# Patient Record
Sex: Male | Born: 1995 | Race: Black or African American | Hispanic: No | Marital: Single | Smoking: Never smoker
Health system: Southern US, Community
[De-identification: ages and names within clinical notes are randomized; demographics above are authoritative.]

## PROBLEM LIST (undated history)

## (undated) DIAGNOSIS — J45909 Unspecified asthma, uncomplicated: Secondary | ICD-10-CM

## (undated) DIAGNOSIS — L309 Dermatitis, unspecified: Secondary | ICD-10-CM

---
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
## 2016-09-28 DIAGNOSIS — Z23 Encounter for immunization: Secondary | ICD-10-CM | POA: Diagnosis not present

## 2016-11-15 DIAGNOSIS — Z0000 Encounter for general adult medical examination without abnormal findings: Secondary | ICD-10-CM | POA: Diagnosis not present

## 2016-11-15 DIAGNOSIS — Z1389 Encounter for screening for other disorder: Secondary | ICD-10-CM | POA: Diagnosis not present

## 2016-12-16 DIAGNOSIS — D729 Disorder of white blood cells, unspecified: Secondary | ICD-10-CM | POA: Diagnosis not present

## 2016-12-22 ENCOUNTER — Encounter: Payer: Self-pay | Admitting: Hematology and Oncology

## 2016-12-22 ENCOUNTER — Telehealth: Payer: Self-pay | Admitting: Hematology and Oncology

## 2016-12-22 NOTE — Telephone Encounter (Signed)
Received a call from the referring office. Scheduled an appt for the pt to see Dr. Pamelia HoitGudena on 2/19 at 1pm. They will notify the pt. Letter mailed to the pt.

## 2016-12-28 DIAGNOSIS — J4530 Mild persistent asthma, uncomplicated: Secondary | ICD-10-CM | POA: Diagnosis not present

## 2016-12-28 DIAGNOSIS — J3089 Other allergic rhinitis: Secondary | ICD-10-CM | POA: Diagnosis not present

## 2016-12-28 DIAGNOSIS — L209 Atopic dermatitis, unspecified: Secondary | ICD-10-CM | POA: Diagnosis not present

## 2016-12-28 DIAGNOSIS — J301 Allergic rhinitis due to pollen: Secondary | ICD-10-CM | POA: Diagnosis not present

## 2017-01-09 ENCOUNTER — Ambulatory Visit: Payer: BLUE CROSS/BLUE SHIELD

## 2017-01-09 ENCOUNTER — Encounter: Payer: Self-pay | Admitting: Hematology and Oncology

## 2017-01-09 ENCOUNTER — Ambulatory Visit (HOSPITAL_BASED_OUTPATIENT_CLINIC_OR_DEPARTMENT_OTHER): Payer: BLUE CROSS/BLUE SHIELD | Admitting: Hematology and Oncology

## 2017-01-09 ENCOUNTER — Other Ambulatory Visit (HOSPITAL_COMMUNITY)
Admission: RE | Admit: 2017-01-09 | Discharge: 2017-01-09 | Disposition: A | Discharge status: Home / Self Care | Payer: BLUE CROSS/BLUE SHIELD | Source: Ambulatory Visit | Attending: Hematology and Oncology | Admitting: Hematology and Oncology

## 2017-01-09 VITALS — BP 114/71 | HR 74 | Temp 98.0°F | Resp 18 | Wt 132.2 lb

## 2017-01-09 DIAGNOSIS — D709 Neutropenia, unspecified: Secondary | ICD-10-CM | POA: Insufficient documentation

## 2017-01-09 DIAGNOSIS — D708 Other neutropenia: Secondary | ICD-10-CM

## 2017-01-09 LAB — CBC & DIFF AND RETIC
BASO%: 1.2 % (ref 0.0–2.0)
Basophils Absolute: 0 10*3/uL (ref 0.0–0.1)
EOS%: 2.1 % (ref 0.0–7.0)
Eosinophils Absolute: 0.1 10*3/uL (ref 0.0–0.5)
HCT: 45.8 % (ref 38.4–49.9)
HGB: 16.6 g/dL (ref 13.0–17.1)
Immature Retic Fract: 0.4 % — ABNORMAL LOW (ref 3.00–10.60)
LYMPH%: 44.6 % (ref 14.0–49.0)
MCH: 32.9 pg (ref 27.2–33.4)
MCHC: 36.2 g/dL — ABNORMAL HIGH (ref 32.0–36.0)
MCV: 90.9 fL (ref 79.3–98.0)
MONO#: 0.3 10*3/uL (ref 0.1–0.9)
MONO%: 12.8 % (ref 0.0–14.0)
NEUT#: 1 10*3/uL — ABNORMAL LOW (ref 1.5–6.5)
NEUT%: 39.3 % (ref 39.0–75.0)
Platelets: 228 10*3/uL (ref 140–400)
RBC: 5.04 10*6/uL (ref 4.20–5.82)
RDW: 11.3 % (ref 11.0–14.6)
Retic %: 1.07 % (ref 0.80–1.80)
Retic Ct Abs: 53.93 10*3/uL (ref 34.80–93.90)
WBC: 2.4 10*3/uL — ABNORMAL LOW (ref 4.0–10.3)
lymph#: 1.1 10*3/uL (ref 0.9–3.3)

## 2017-01-09 LAB — MORPHOLOGY
PLT EST: ADEQUATE
RBC Comments: NORMAL

## 2017-01-09 LAB — LACTATE DEHYDROGENASE: LDH: 138 U/L (ref 125–245)

## 2017-01-09 NOTE — Assessment & Plan Note (Signed)
Differential diagnosis: 1. Infections: Especially viruses like HIV 2. inflammation/autoimmune causes including lupus/rheumatoid arthritis 3. Nutritional causes but N-54 or folic acid deficiency 4. Medication induced 5. Bone marrow disorders  Workup today: 1. CBC with differential with smear 2. O-48 and folic acid levels 3. ANA 4. Flow cytometry  Return to clinic in 1 week to discuss the results and consider bone marrow biopsy if necessary

## 2017-01-09 NOTE — Progress Notes (Signed)
[facility] CONSULT NOTE  No care team member to display  CHIEF COMPLAINTS/PURPOSE OF CONSULTATION:  Newly diagnosed breast cancer  HISTORY OF PRESENTING ILLNESS:  Jackson Buchanan 21 y.o. male is here because of recent diagnosis of leukopenia especially neutropenia. Patient had a workup done 11/15/2016 that revealed white count of 2.6. This was once again repeated 12/19/2015 and it showed the same amount of white blood cells of 2.6 and the differential.the second time revealed an [place] of 1000. The rest of the differential and the rest of the CBC were normal. Because of this change in the white blood cell count he was referred to see [place]. Patient is here today accompanied by his parents and his grandmother. 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 He stays fairly busy. He has not lost any weight. His appetite is good. He denies any fevers chills night sweats or weight loss. He does have mild fatigue although it could be related to the stress of education as well as lack of sleep during nighttime.  I reviewed her records extensively and collaborated the history with the patient.   MEDICAL HISTORY:  Asthma, eczema SURGICAL HISTORY: No prior surgeries SOCIAL HISTORY: Denies any alcohol or recreational drug use or smoking. FAMILY HISTORY: No family history of any cancers.  ALLERGIES:  has no allergies on file.  MEDICATIONS: Beclomethasone inhalation, the Willis of present, albuterol,Dymista inh REVIEW OF SYSTEMS:   Constitutional: Denies fevers, chills or abnormal night sweats Eyes: Denies blurriness of vision, double vision or watery eyes Ears, nose, mouth, throat, and face: Denies mucositis or sore throat Respiratory: Denies cough, dyspnea or wheezes Cardiovascular: Denies palpitation, chest discomfort or lower extremity swelling Gastrointestinal:  Denies nausea, heartburn or change in bowel habits Skin: Denies abnormal skin rashes Lymphatics:  Denies new lymphadenopathy or easy bruising Neurological:Denies numbness, tingling or new weaknesses Behavioral/Psych: Mood is stable, no new changes   All other systems were reviewed with the patient and are negative.  PHYSICAL EXAMINATION: ECOG PERFORMANCE STATUS: 0 - Asymptomatic  Vitals:   01/09/17 1312  BP: 114/71  Pulse: 74  Resp: 18  Temp: 98 F (36.7 C)   Filed Weights   01/09/17 1312  Weight: 132 lb 3.2 oz (60 kg)    GENERAL:alert, no distress and comfortable SKIN: skin color, texture, turgor are normal, no rashes or significant lesions EYES: normal, conjunctiva are pink and non-injected, sclera clear OROPHARYNX:no exudate, no erythema and lips, buccal mucosa, and tongue normal  NECK: supple, thyroid normal size, non-tender, without nodularity LYMPH:  no palpable lymphadenopathy in the cervical, axillary or inguinal LUNGS: clear to auscultation and percussion with normal breathing effort HEART: regular rate & rhythm and no murmurs and no lower extremity edema ABDOMEN:abdomen soft, non-tender and normal bowel sounds Musculoskeletal:no cyanosis of digits and no clubbing  PSYCH: alert & oriented x 3 with fluent speech NEURO: no focal motor/sensory deficits   LABORATORY DATA:  I have reviewed the data as listed Lab Results  Component Value Date   WBC 2.4 (L) 01/09/2017   HGB 16.6 01/09/2017   HCT 45.8 01/09/2017   MCV 90.9 01/09/2017   PLT 228 01/09/2017    ASSESSMENT AND PLAN:  Neutropenia (HCC) Differential diagnosis: 1. Infections: Especially viruses like HIV 2. inflammation/autoimmune causes including lupus/rheumatoid arthritis 3. Nutritional causes but H-21 or folic acid deficiency 4. Medication induced 5. Bone marrow disorders  Workup today: 1. CBC with differential with smear: WBC 2.4 2. B-12  and folic acid levels 3. ANA 4. Flow cytometry  Return to clinic in 1 week to discuss the results and consider bone marrow biopsy if  necessary      All questions were answered. The patient knows to call the clinic with any problems, questions or concerns.    Rulon Eisenmenger, MD 01/09/17

## 2017-01-10 LAB — VITAMIN B12: Vitamin B12: 873 pg/mL (ref 232–1245)

## 2017-01-10 LAB — ANTINUCLEAR ANTIBODIES, IFA
ANA Titer 1: POSITIVE — AB
Homogeneous Pattern: 1:80 {titer}
Speckled Pattern: 1:160 {titer} — ABNORMAL HIGH

## 2017-01-10 LAB — HIV ANTIBODY (ROUTINE TESTING W REFLEX): HIV Screen 4th Generation wRfx: NONREACTIVE

## 2017-01-10 LAB — FOLATE: Folate: 15.3 ng/mL (ref >3.0)

## 2017-01-12 LAB — FLOW CYTOMETRY

## 2017-01-16 ENCOUNTER — Encounter: Payer: Self-pay | Admitting: Hematology and Oncology

## 2017-01-16 ENCOUNTER — Ambulatory Visit (HOSPITAL_BASED_OUTPATIENT_CLINIC_OR_DEPARTMENT_OTHER): Payer: BLUE CROSS/BLUE SHIELD | Admitting: Hematology and Oncology

## 2017-01-16 DIAGNOSIS — D708 Other neutropenia: Secondary | ICD-10-CM

## 2017-01-16 DIAGNOSIS — D709 Neutropenia, unspecified: Secondary | ICD-10-CM | POA: Diagnosis not present

## 2017-01-16 NOTE — Progress Notes (Signed)
No care team member to display  DIAGNOSIS:  Encounter Diagnosis  Name Primary?  . Other neutropenia ([place])     CHIEF COMPLIANT: Follow-up to review blood counts  INTERVAL HISTORY: Jackson Buchanan is a 21 year old with above-mentioned history of neutropenia who is here for workup and to review the labs done recently. He is accompanied by his family to discuss the results. He denies any frequent infections.  REVIEW OF SYSTEMS:   Constitutional: Denies fevers, chills or abnormal weight loss Eyes: Denies blurriness of vision Ears, nose, mouth, throat, and face: Denies mucositis or sore throat Respiratory: Denies cough, dyspnea or wheezes Cardiovascular: Denies palpitation, chest discomfort Gastrointestinal:  Denies nausea, heartburn or change in bowel habits Skin: Denies abnormal skin rashes Lymphatics: Denies new lymphadenopathy or easy bruising Neurological:Denies numbness, tingling or new weaknesses Behavioral/Psych: Mood is stable, no new changes  Extremities: No lower extremity edema  All other systems were reviewed with the patient and are negative.  I have reviewed the past medical history, past surgical history, social history and family history with the patient and they are unchanged from previous note.  ALLERGIES:  has no allergies on file.  PHYSICAL EXAMINATION: ECOG PERFORMANCE STATUS: 0 - Asymptomatic  There were no vitals filed for this visit. There were no vitals filed for this visit.  GENERAL:alert, no distress and comfortable SKIN: skin color, texture, turgor are normal, no rashes or significant lesions EYES: normal, Conjunctiva are pink and non-injected, sclera clear OROPHARYNX:no exudate, no erythema and lips, buccal mucosa, and tongue normal  NECK: supple, thyroid normal size, non-tender, without nodularity LYMPH:  no palpable lymphadenopathy in the cervical, axillary or inguinal LUNGS: clear to auscultation and percussion with normal breathing  effort HEART: regular rate & rhythm and no murmurs and no lower extremity edema ABDOMEN:abdomen soft, non-tender and normal bowel sounds MUSCULOSKELETAL:no cyanosis of digits and no clubbing  NEURO: alert & oriented x 3 with fluent speech, no focal motor/sensory deficits EXTREMITIES: No lower extremity edema  LABORATORY DATA:  I have reviewed the data as listed   Chemistry   No results found for: NA, K, CL, CO2, BUN, CREATININE, GLU No results found for: CALCIUM, ALKPHOS, AST, ALT, BILITOT     Lab Results  Component Value Date   WBC 2.4 (L) 01/09/2017   HGB 16.6 01/09/2017   HCT 45.8 01/09/2017   MCV 90.9 01/09/2017   PLT 228 01/09/2017   NEUTROABS 1.0 (L) 01/09/2017    ASSESSMENT & PLAN:  Neutropenia (HCC) Differential diagnosis: 1. Infections: Especially viruses like HIV 2. inflammation/autoimmune causes including lupus: ANA positive  3. Nutritional causes L-89 or folic acid deficiency: These tests were normal 4. Medication induced: Patient does not take any medications 5. Bone marrow disorders: No abnormalities of red cells or platelets and flow cytometry is normal  Workup today: 1. CBC with differential with smear: WBC 2.4, ANC 1; Hb 16.6, Pl 228 (on 01/09/17) 2. Q-11: 941 and folic acid levels: 74.0; LDH: 138 3. ANA: Positive Titers 1:160 speckled pattern 4. Flow cytometry: Neg 5. HIV: Neg  Discussion: I discussed with the patient that ANA is positive suggesting that the patient may have SLE. I explained to the patient that only a rheumatologist can accurately diagnose and recommend treatment for this condition.  I recommend a rheumatology consultation. If rheumatology felt that he needs a bone marrow biopsy, we're happy to get that done.  Return to clinic in 3 months for follow-up and recheck of his blood counts.  I spent 25 minutes talking to the patient of which more than half was spent in counseling and coordination of care.  No orders of the defined types  were placed in this encounter.  The patient has a good understanding of the overall plan. he agrees with it. he will call with any problems that may develop before the next visit here.   Rulon Eisenmenger, MD 01/16/17

## 2017-01-16 NOTE — Assessment & Plan Note (Signed)
Differential diagnosis: 1. Infections: Especially viruses like HIV 2. inflammation/autoimmune causes including lupus/rheumatoid arthritis 3. Nutritional causes but B-34 or folic acid deficiency 4. Medication induced 5. Bone marrow disorders  Workup today: 1. CBC with differential with smear: WBC 2.4, ANC 1; Hb 16.6, Pl 228 (on 01/09/17) 2. Y-37: 096 and folic acid levels: 43.8; LDH: 138 3. ANA: Positive Titers 1:160 4. Flow cytometry: Neg 5. HIV: Neg  Discussion: I discussed with the patient that ANA is positive suggesting that the patient may have SLE. I explained to the patient that only a rheumatologist can accurately diagnose and recommend treatment for this condition.  I recommend a rheumatology consultation. If rheumatology felt that he needs a bone marrow biopsy, we're happy to get that done.  Return to clinic in 4 months for follow-up and recheck of his blood counts.

## 2017-01-31 ENCOUNTER — Encounter: Payer: Self-pay | Admitting: Hematology and Oncology

## 2017-02-06 ENCOUNTER — Other Ambulatory Visit: Payer: Self-pay

## 2017-02-06 DIAGNOSIS — D708 Other neutropenia: Secondary | ICD-10-CM

## 2017-02-13 ENCOUNTER — Other Ambulatory Visit: Payer: BLUE CROSS/BLUE SHIELD

## 2017-03-07 ENCOUNTER — Other Ambulatory Visit: Payer: Self-pay | Admitting: Emergency Medicine

## 2017-03-07 DIAGNOSIS — D709 Neutropenia, unspecified: Secondary | ICD-10-CM

## 2017-03-09 ENCOUNTER — Telehealth: Payer: Self-pay | Admitting: Emergency Medicine

## 2017-03-09 NOTE — Telephone Encounter (Signed)
Faxed- office visit notes, lab results, demographic form, insurance card to [facility], [place] and Dr Helane Gunther per patient's request for referral to Rheumatology.

## 2017-03-27 DIAGNOSIS — D709 Neutropenia, unspecified: Secondary | ICD-10-CM | POA: Diagnosis not present

## 2017-03-27 DIAGNOSIS — R5383 Other fatigue: Secondary | ICD-10-CM | POA: Diagnosis not present

## 2017-03-27 DIAGNOSIS — R768 Other specified abnormal immunological findings in serum: Secondary | ICD-10-CM | POA: Diagnosis not present

## 2017-04-12 DIAGNOSIS — D709 Neutropenia, unspecified: Secondary | ICD-10-CM | POA: Diagnosis not present

## 2017-04-12 DIAGNOSIS — R768 Other specified abnormal immunological findings in serum: Secondary | ICD-10-CM | POA: Diagnosis not present

## 2017-04-14 ENCOUNTER — Ambulatory Visit (HOSPITAL_BASED_OUTPATIENT_CLINIC_OR_DEPARTMENT_OTHER): Payer: BLUE CROSS/BLUE SHIELD | Admitting: Hematology and Oncology

## 2017-04-14 ENCOUNTER — Other Ambulatory Visit (HOSPITAL_BASED_OUTPATIENT_CLINIC_OR_DEPARTMENT_OTHER): Payer: BLUE CROSS/BLUE SHIELD

## 2017-04-14 ENCOUNTER — Encounter: Payer: Self-pay | Admitting: Hematology and Oncology

## 2017-04-14 ENCOUNTER — Telehealth: Payer: Self-pay | Admitting: Hematology and Oncology

## 2017-04-14 DIAGNOSIS — D709 Neutropenia, unspecified: Secondary | ICD-10-CM

## 2017-04-14 DIAGNOSIS — D708 Other neutropenia: Secondary | ICD-10-CM | POA: Diagnosis not present

## 2017-04-14 LAB — CBC WITH DIFFERENTIAL/PLATELET
BASO%: 1.4 % (ref 0.0–2.0)
Basophils Absolute: 0 10*3/uL (ref 0.0–0.1)
EOS%: 3.4 % (ref 0.0–7.0)
Eosinophils Absolute: 0.1 10*3/uL (ref 0.0–0.5)
HCT: 47.8 % (ref 38.4–49.9)
HGB: 16.5 g/dL (ref 13.0–17.1)
LYMPH%: 46.9 % (ref 14.0–49.0)
MCH: 32.7 pg (ref 27.2–33.4)
MCHC: 34.6 g/dL (ref 32.0–36.0)
MCV: 94.5 fL (ref 79.3–98.0)
MONO#: 0.4 10*3/uL (ref 0.1–0.9)
MONO%: 11.9 % (ref 0.0–14.0)
NEUT#: 1.2 10*3/uL — ABNORMAL LOW (ref 1.5–6.5)
NEUT%: 36.4 % — ABNORMAL LOW (ref 39.0–75.0)
Platelets: 250 10*3/uL (ref 140–400)
RBC: 5.05 10*6/uL (ref 4.20–5.82)
RDW: 11.6 % (ref 11.0–14.6)
WBC: 3.3 10*3/uL — ABNORMAL LOW (ref 4.0–10.3)
lymph#: 1.5 10*3/uL (ref 0.9–3.3)

## 2017-04-14 NOTE — Assessment & Plan Note (Signed)
Previous workup for neutropenia: 1. B-12: 873 and folic acid levels: 15.3; LDH: 138 2. ANA: Positive Titers 1:160 speckled pattern 3. Flow cytometry: Neg 4. HIV: Neg Rheumatology consultation requested  Lab work review:

## 2017-04-14 NOTE — Progress Notes (Signed)
Patient Care Team: Wenda Low, MD as PCP - General (Internal Medicine)  DIAGNOSIS:  Encounter Diagnosis  Name Primary?  . Other neutropenia ([place])     CHIEF COMPLIANT: Follow-up of neutropenia  INTERVAL HISTORY: Jackson Buchanan is a 21 year old with above-mentioned history of neutropenia who was found to be positive for ANA. Patient is here to recheck the blood counts. Denies any new complaints or concerns. Denies any fevers or chills. Patient saw rheumatology and was told that he does not have lupus.  REVIEW OF SYSTEMS:   Constitutional: Denies fevers, chills or abnormal weight loss Eyes: Denies blurriness of vision Ears, nose, mouth, throat, and face: Denies mucositis or sore throat Respiratory: Denies cough, dyspnea or wheezes Cardiovascular: Denies palpitation, chest discomfort Gastrointestinal:  Denies nausea, heartburn or change in bowel habits Skin: Denies abnormal skin rashes Lymphatics: Denies new lymphadenopathy or easy bruising Neurological:Denies numbness, tingling or new weaknesses Behavioral/Psych: Mood is stable, no new changes  Extremities: No lower extremity edema All other systems were reviewed with the patient and are negative.  I have reviewed the past medical history, past surgical history, social history and family history with the patient and they are unchanged from previous note.  ALLERGIES:  has no allergies on file.  MEDICATIONS:  No current outpatient prescriptions on file.   No current facility-administered medications for this visit.     PHYSICAL EXAMINATION: ECOG PERFORMANCE STATUS: 0 - Asymptomatic  Vitals:   04/14/17 0812  BP: 100/70  Pulse: 68  Resp: 18  Temp: 98.2 F (36.8 C)   Filed Weights   04/14/17 0812  Weight: 135 lb 8 oz (61.5 kg)    GENERAL:alert, no distress and comfortable SKIN: skin color, texture, turgor are normal, no rashes or significant lesions EYES: normal, Conjunctiva are pink and non-injected, sclera  clear OROPHARYNX:no exudate, no erythema and lips, buccal mucosa, and tongue normal  NECK: supple, thyroid normal size, non-tender, without nodularity LYMPH:  no palpable lymphadenopathy in the cervical, axillary or inguinal LUNGS: clear to auscultation and percussion with normal breathing effort HEART: regular rate & rhythm and no murmurs and no lower extremity edema ABDOMEN:abdomen soft, non-tender and normal bowel sounds MUSCULOSKELETAL:no cyanosis of digits and no clubbing  NEURO: alert & oriented x 3 with fluent speech, no focal motor/sensory deficits EXTREMITIES: No lower extremity edema  LABORATORY DATA:  I have reviewed the data as listed   Chemistry   No results found for: NA, K, CL, CO2, BUN, CREATININE, GLU No results found for: CALCIUM, ALKPHOS, AST, ALT, BILITOT     Lab Results  Component Value Date   WBC 3.3 (L) 04/14/2017   HGB 16.5 04/14/2017   HCT 47.8 04/14/2017   MCV 94.5 04/14/2017   PLT 250 04/14/2017   NEUTROABS 1.2 (L) 04/14/2017    ASSESSMENT & PLAN:  Neutropenia (HCC) Previous workup for neutropenia: 1. W-09: 811 and folic acid levels: 91.4; LDH: 138 2. ANA: Positive Titers 1:160 speckled pattern 3. Flow cytometry: Neg 4. HIV: Neg Rheumatology consultation requested  Lab work review 04/14/2017 :  WBC 3.3 ANC 1.2 (up from 1) Hemoglobin 16.5 Platelets 250  Patient saw rheumatology and had extensive workup. He was informed that he does not have lupus. Because of this I recommended that we perform a bone marrow biopsy. We will do this on 04/24/2017. I counseled the patient extensively regarding the procedure for the bone marrow aspiration biopsy. I was able to answer all their questions.  I spent 25 minutes talking to the  patient of which more than half was spent in counseling and coordination of care.  No orders of the defined types were placed in this encounter.  The patient has a good understanding of the overall plan. he agrees with it. he  will call with any problems that may develop before the next visit here.   Rulon Eisenmenger, MD 04/14/17

## 2017-04-14 NOTE — Telephone Encounter (Signed)
Gave patient AVS and calender per 5/25 LOS.  

## 2017-04-21 ENCOUNTER — Other Ambulatory Visit: Payer: Self-pay

## 2017-04-21 DIAGNOSIS — D708 Other neutropenia: Secondary | ICD-10-CM

## 2017-04-23 ENCOUNTER — Ambulatory Visit (HOSPITAL_COMMUNITY)
Admission: EM | Admit: 2017-04-23 | Discharge: 2017-04-23 | Discharge status: Against Medical Advice | Payer: BLUE CROSS/BLUE SHIELD

## 2017-04-24 ENCOUNTER — Other Ambulatory Visit (HOSPITAL_COMMUNITY)
Admission: RE | Admit: 2017-04-24 | Discharge: 2017-04-24 | Disposition: A | Discharge status: Home / Self Care | Payer: BLUE CROSS/BLUE SHIELD | Source: Ambulatory Visit | Attending: Hematology and Oncology | Admitting: Hematology and Oncology

## 2017-04-24 ENCOUNTER — Other Ambulatory Visit (HOSPITAL_BASED_OUTPATIENT_CLINIC_OR_DEPARTMENT_OTHER): Payer: BLUE CROSS/BLUE SHIELD

## 2017-04-24 ENCOUNTER — Ambulatory Visit (HOSPITAL_BASED_OUTPATIENT_CLINIC_OR_DEPARTMENT_OTHER): Payer: BLUE CROSS/BLUE SHIELD | Admitting: Hematology and Oncology

## 2017-04-24 ENCOUNTER — Other Ambulatory Visit: Payer: BLUE CROSS/BLUE SHIELD

## 2017-04-24 VITALS — BP 117/72 | HR 71 | Temp 98.4°F

## 2017-04-24 DIAGNOSIS — D708 Other neutropenia: Secondary | ICD-10-CM

## 2017-04-24 DIAGNOSIS — D7589 Other specified diseases of blood and blood-forming organs: Secondary | ICD-10-CM | POA: Diagnosis not present

## 2017-04-24 DIAGNOSIS — D709 Neutropenia, unspecified: Secondary | ICD-10-CM | POA: Diagnosis not present

## 2017-04-24 LAB — CBC WITH DIFFERENTIAL/PLATELET
BASO%: 1.2 % (ref 0.0–2.0)
Basophils Absolute: 0 10*3/uL (ref 0.0–0.1)
EOS%: 2.2 % (ref 0.0–7.0)
Eosinophils Absolute: 0.1 10*3/uL (ref 0.0–0.5)
HCT: 47.5 % (ref 38.4–49.9)
HGB: 16.4 g/dL (ref 13.0–17.1)
LYMPH%: 45.1 % (ref 14.0–49.0)
MCH: 32.7 pg (ref 27.2–33.4)
MCHC: 34.5 g/dL (ref 32.0–36.0)
MCV: 94.8 fL (ref 79.3–98.0)
MONO#: 0.3 10*3/uL (ref 0.1–0.9)
MONO%: 11.5 % (ref 0.0–14.0)
NEUT#: 1.2 10*3/uL — ABNORMAL LOW (ref 1.5–6.5)
NEUT%: 40 % (ref 39.0–75.0)
Platelets: 243 10*3/uL (ref 140–400)
RBC: 5.02 10*6/uL (ref 4.20–5.82)
RDW: 11.8 % (ref 11.0–14.6)
WBC: 2.9 10*3/uL — ABNORMAL LOW (ref 4.0–10.3)
lymph#: 1.3 10*3/uL (ref 0.9–3.3)

## 2017-04-24 LAB — COMPREHENSIVE METABOLIC PANEL
ALT: 16 U/L (ref 0–55)
AST: 21 U/L (ref 5–34)
Albumin: 4.4 g/dL (ref 3.5–5.0)
Alkaline Phosphatase: 80 U/L (ref 40–150)
Anion Gap: 7 mEq/L (ref 3–11)
BUN: 12.5 mg/dL (ref 7.0–26.0)
CO2: 29 mEq/L (ref 22–29)
Calcium: 9.6 mg/dL (ref 8.4–10.4)
Chloride: 104 mEq/L (ref 98–109)
Creatinine: 1 mg/dL (ref 0.7–1.3)
EGFR: >90 mL/min/{1.73_m2} (ref >90)
Glucose: 92 mg/dl (ref 70–140)
Potassium: 4.6 mEq/L (ref 3.5–5.1)
Sodium: 140 mEq/L (ref 136–145)
Total Bilirubin: 0.76 mg/dL (ref 0.20–1.20)
Total Protein: 7.8 g/dL (ref 6.4–8.3)

## 2017-04-24 NOTE — Progress Notes (Signed)
Bone Marrow Biopsy and Aspiration Procedure Note   Informed consent was obtained and potential risks including bleeding, infection and pain were reviewed with the patient. I verified that the patient has been fasting since midnight.  The patient's name, date of birth, identification, consent and allergies were verified prior to the start of procedure and time out was performed.  The left posterior iliac crest was chosen as the site of biopsy.  The skin was prepped with Chlorprep.   8 cc of 1% lidocaine was used to provide local anaesthesia.   10 cc of bone marrow aspirate was obtained followed by 1 inch biopsy.  Pressure was applied to the biopsy site and bandage was placed over the biopsy site. Patient was made to lie on her back for 15 mins prior to discharge.  The procedure was tolerated well. COMPLICATIONS: None BLOOD LOSS: none The patient was discharged home in stable condition with a 1 week follow up to review results. She was provided with post bone marrow biopsy instructions and instructed to call if there was any bleeding or worsening pain.  Specimens sent for flow cytometry, cytogenetics and additional studies.  Signed Gudena, Vinay K, MD   

## 2017-04-24 NOTE — Progress Notes (Signed)
Patient discharged ambulatory. Dressing dry and intact. Bone Marrow Biopsy discharge instructions provided to patient. Patient and parents verbalized understanding.

## 2017-04-24 NOTE — Patient Instructions (Signed)
Bone Marrow Aspiration and Bone Marrow Biopsy, Adult, Care After This sheet gives you information about how to care for yourself after your procedure. Your health care provider may also give you more specific instructions. If you have problems or questions, contact your health care provider. What can I expect after the procedure? After the procedure, it is common to have:  Mild pain and tenderness.  Swelling.  Bruising.  Follow these instructions at home:  Take over-the-counter or prescription medicines only as told by your health care provider.  Do not take baths, swim, or use a hot tub until your health care provider approves. Ask if you can take a shower or have a sponge bath.  Follow instructions from your health care provider about how to take care of the puncture site. Make sure you: ? Wash your hands with soap and water before you change your bandage (dressing). If soap and water are not available, use hand sanitizer. ? Change your dressing as told by your health care provider.  Check your puncture siteevery day for signs of infection. Check for: ? More redness, swelling, or pain. ? More fluid or blood. ? Warmth. ? Pus or a bad smell.  Return to your normal activities as told by your health care provider. Ask your health care provider what activities are safe for you.  Do not drive for 24 hours if you were given a medicine to help you relax (sedative).  Keep all follow-up visits as told by your health care provider. This is important. Contact a health care provider if:  You have more redness, swelling, or pain around the puncture site.  You have more fluid or blood coming from the puncture site.  Your puncture site feels warm to the touch.  You have pus or a bad smell coming from the puncture site.  You have a fever.  Your pain is not controlled with medicine. This information is not intended to replace advice given to you by your health care provider. Make sure  you discuss any questions you have with your health care provider. Document Released: 05/27/2005 Document Revised: 05/27/2016 Document Reviewed: 04/20/2016 Elsevier Interactive Patient Education  2018 [employer].

## 2017-05-02 ENCOUNTER — Ambulatory Visit (HOSPITAL_BASED_OUTPATIENT_CLINIC_OR_DEPARTMENT_OTHER): Payer: BLUE CROSS/BLUE SHIELD | Admitting: Hematology and Oncology

## 2017-05-02 ENCOUNTER — Encounter: Payer: Self-pay | Admitting: Hematology and Oncology

## 2017-05-02 DIAGNOSIS — D709 Neutropenia, unspecified: Secondary | ICD-10-CM

## 2017-05-02 DIAGNOSIS — D708 Other neutropenia: Secondary | ICD-10-CM

## 2017-05-02 NOTE — Assessment & Plan Note (Signed)
1. F-20: 721 and folic acid levels: 82.8; LDH: 138 2. ANA: Positive Titers 1:160 speckled pattern 3. Flow cytometry: Neg 4. HIV: Neg Rheumatology consultation: Did not think he has lupus   Bone Marrow Biopsy 04/25/17: The bone marrow is variably cellular (slightly hypocellular for age) with trilineage hematopoiesis but with relative abundance of erythroid precursors and apparently decreased granulocytic cells. The latter however, displays progressive maturation including relative abundance of mature neutrophils. No significant dysgranulopoiesis or increase in blastic cells is identified. Megakaryocytes are abundant with scattered atypical forms. The overall myeloid changes are not considered specific and likely represent secondary changesPrevious workup for neutropenia:  Discussion; Indicates auto immune process like lupus and no primary bone marrow disorder. Plan: Watchful monitoring

## 2017-05-02 NOTE — Progress Notes (Signed)
Patient Care Team: Wenda Low, MD as PCP - General (Internal Medicine)  DIAGNOSIS:  Encounter Diagnosis  Name Primary?  . Other neutropenia ([place])     CHIEF COMPLIANT: Follow-up of neutropenia after recent bone marrow biopsy  INTERVAL HISTORY: Jackson Buchanan is a 21 year old with above-mentioned history of neutropenia with an ANC 1.2 is here for a follow-up for a bone marrow biopsy that he had last week. He does not have any pain or discomfort from the bone marrow procedure. He had gone to [place] and spent several days in [place].  REVIEW OF SYSTEMS:   Constitutional: Denies fevers, chills or abnormal weight loss Eyes: Denies blurriness of vision Ears, nose, mouth, throat, and face: Denies mucositis or sore throat Respiratory: Denies cough, dyspnea or wheezes Cardiovascular: Denies palpitation, chest discomfort Gastrointestinal:  Denies nausea, heartburn or change in bowel habits Skin: Denies abnormal skin rashes Lymphatics: Denies new lymphadenopathy or easy bruising Neurological:Denies numbness, tingling or new weaknesses Behavioral/Psych: Mood is stable, no new changes  Extremities: No lower extremity edema  All other systems were reviewed with the patient and are negative.  I have reviewed the past medical history, past surgical history, social history and family history with the patient and they are unchanged from previous note.  ALLERGIES:  has no allergies on file.  MEDICATIONS:  No current outpatient prescriptions on file.   No current facility-administered medications for this visit.     PHYSICAL EXAMINATION: ECOG PERFORMANCE STATUS: 0 - Asymptomatic  Vitals:   05/02/17 1418  BP: 119/71  Pulse: 73  Resp: 18  Temp: 97.3 F (36.3 C)   Filed Weights   05/02/17 1418  Weight: 135 lb 9.6 oz (61.5 kg)    GENERAL:alert, no distress and comfortable SKIN: skin color, texture, turgor are normal, no rashes or significant lesions EYES: normal, Conjunctiva  are pink and non-injected, sclera clear OROPHARYNX:no exudate, no erythema and lips, buccal mucosa, and tongue normal  NECK: supple, thyroid normal size, non-tender, without nodularity LYMPH:  no palpable lymphadenopathy in the cervical, axillary or inguinal LUNGS: clear to auscultation and percussion with normal breathing effort HEART: regular rate & rhythm and no murmurs and no lower extremity edema ABDOMEN:abdomen soft, non-tender and normal bowel sounds MUSCULOSKELETAL:no cyanosis of digits and no clubbing  NEURO: alert & oriented x 3 with fluent speech, no focal motor/sensory deficits EXTREMITIES: No lower extremity edema  LABORATORY DATA:  I have reviewed the data as listed   Chemistry      Component Value Date/Time   NA 140 04/24/2017 1020   K 4.6 04/24/2017 1020   CO2 29 04/24/2017 1020   BUN 12.5 04/24/2017 1020   CREATININE 1.0 04/24/2017 1020      Component Value Date/Time   CALCIUM 9.6 04/24/2017 1020   ALKPHOS 80 04/24/2017 1020   AST 21 04/24/2017 1020   ALT 16 04/24/2017 1020   BILITOT 0.76 04/24/2017 1020       Lab Results  Component Value Date   WBC 2.9 (L) 04/24/2017   HGB 16.4 04/24/2017   HCT 47.5 04/24/2017   MCV 94.8 04/24/2017   PLT 243 04/24/2017   NEUTROABS 1.2 (L) 04/24/2017    ASSESSMENT & PLAN:  Neutropenia (HCC) Previous workup for neutropenia: 1. W-10: 272 and folic acid levels: 53.6; LDH: 138 2. ANA: Positive Titers 1:160 speckled pattern 3. Flow cytometry: Neg 4. HIV: Neg Rheumatology consultation: Did not think he has lupus  Bone Marrow Biopsy 04/25/17: The bone marrow is variably cellular (slightly  hypocellular for age) with trilineage hematopoiesis but with relative abundance of erythroid precursors and apparently decreased granulocytic cells. The latter however, displays progressive maturation including relative abundance of mature neutrophils. No significant dysgranulopoiesis or increase in blastic cells is identified.  Megakaryocytes are abundant with scattered atypical forms. The overall myeloid changes are not considered specific and likely represent secondary changes  Discussion; Indicates auto immune process like lupus and no primary bone marrow disorder. Plan: Watchful monitoring with recheck of CBC in 3 months. If remains stable then we can recheck in 6 months after that. Patient plans to go to [facility] for second opinion regarding rheumatology evaluation.   I spent 25 minutes talking to the patient of which more than half was spent in counseling and coordination of care.  Orders Placed This Encounter  Procedures  . CBC with Differential    Standing Status:   Future    Standing Expiration Date:   05/02/2018   The patient has a good understanding of the overall plan. he agrees with it. he will call with any problems that may develop before the next visit here.   Rulon Eisenmenger, MD 05/02/17

## 2017-05-05 DIAGNOSIS — J309 Allergic rhinitis, unspecified: Secondary | ICD-10-CM | POA: Diagnosis not present

## 2017-05-05 DIAGNOSIS — J329 Chronic sinusitis, unspecified: Secondary | ICD-10-CM | POA: Diagnosis not present

## 2017-05-17 ENCOUNTER — Encounter (HOSPITAL_COMMUNITY): Payer: Self-pay

## 2017-05-25 DIAGNOSIS — Z79899 Other long term (current) drug therapy: Secondary | ICD-10-CM | POA: Diagnosis not present

## 2017-05-25 DIAGNOSIS — L309 Dermatitis, unspecified: Secondary | ICD-10-CM | POA: Diagnosis not present

## 2017-05-25 DIAGNOSIS — L700 Acne vulgaris: Secondary | ICD-10-CM | POA: Diagnosis not present

## 2017-05-26 LAB — CHROMOSOME ANALYSIS, BONE MARROW

## 2017-05-28 DIAGNOSIS — R1084 Generalized abdominal pain: Secondary | ICD-10-CM | POA: Diagnosis not present

## 2017-05-28 DIAGNOSIS — E010 Iodine-deficiency related diffuse (endemic) goiter: Secondary | ICD-10-CM | POA: Diagnosis not present

## 2017-06-01 ENCOUNTER — Other Ambulatory Visit: Payer: Self-pay | Admitting: Internal Medicine

## 2017-06-01 DIAGNOSIS — D72819 Decreased white blood cell count, unspecified: Secondary | ICD-10-CM | POA: Diagnosis not present

## 2017-06-01 DIAGNOSIS — R109 Unspecified abdominal pain: Secondary | ICD-10-CM | POA: Diagnosis not present

## 2017-06-05 ENCOUNTER — Ambulatory Visit
Admission: RE | Admit: 2017-06-05 | Discharge: 2017-06-05 | Disposition: A | Discharge status: Home / Self Care | Payer: BLUE CROSS/BLUE SHIELD | Source: Ambulatory Visit | Attending: Internal Medicine | Admitting: Internal Medicine

## 2017-06-05 DIAGNOSIS — R112 Nausea with vomiting, unspecified: Secondary | ICD-10-CM | POA: Diagnosis not present

## 2017-06-05 DIAGNOSIS — R1032 Left lower quadrant pain: Secondary | ICD-10-CM | POA: Diagnosis not present

## 2017-06-05 DIAGNOSIS — R109 Unspecified abdominal pain: Secondary | ICD-10-CM

## 2017-06-05 MED ORDER — IOPAMIDOL (ISOVUE-300) INJECTION 61%
100.0000 mL | Freq: Once | INTRAVENOUS | Status: AC | PRN
  Administered 2017-06-05: 100 mL via INTRAVENOUS

## 2017-06-13 DIAGNOSIS — R768 Other specified abnormal immunological findings in serum: Secondary | ICD-10-CM | POA: Diagnosis not present

## 2017-06-22 DIAGNOSIS — L700 Acne vulgaris: Secondary | ICD-10-CM | POA: Diagnosis not present

## 2017-08-02 ENCOUNTER — Other Ambulatory Visit: Payer: BLUE CROSS/BLUE SHIELD

## 2017-08-02 ENCOUNTER — Ambulatory Visit: Payer: BLUE CROSS/BLUE SHIELD | Admitting: Hematology and Oncology

## 2017-08-04 ENCOUNTER — Ambulatory Visit: Payer: BLUE CROSS/BLUE SHIELD | Admitting: Hematology and Oncology

## 2017-08-04 ENCOUNTER — Other Ambulatory Visit: Payer: BLUE CROSS/BLUE SHIELD

## 2017-08-11 ENCOUNTER — Other Ambulatory Visit: Payer: BLUE CROSS/BLUE SHIELD

## 2017-08-11 ENCOUNTER — Ambulatory Visit: Payer: BLUE CROSS/BLUE SHIELD | Admitting: Hematology and Oncology

## 2017-08-14 ENCOUNTER — Telehealth: Payer: Self-pay

## 2017-08-14 ENCOUNTER — Encounter: Payer: Self-pay | Admitting: Hematology and Oncology

## 2017-08-14 ENCOUNTER — Inpatient Hospital Stay: Payer: BLUE CROSS/BLUE SHIELD | Attending: Hematology and Oncology | Admitting: Hematology and Oncology

## 2017-08-14 ENCOUNTER — Other Ambulatory Visit (HOSPITAL_BASED_OUTPATIENT_CLINIC_OR_DEPARTMENT_OTHER): Payer: BLUE CROSS/BLUE SHIELD

## 2017-08-14 DIAGNOSIS — D709 Neutropenia, unspecified: Secondary | ICD-10-CM | POA: Diagnosis not present

## 2017-08-14 DIAGNOSIS — D708 Other neutropenia: Secondary | ICD-10-CM

## 2017-08-14 LAB — CBC WITH DIFFERENTIAL/PLATELET
BASO%: 0.7 % (ref 0.0–2.0)
Basophils Absolute: 0 10*3/uL (ref 0.0–0.1)
EOS%: 4.6 % (ref 0.0–7.0)
Eosinophils Absolute: 0.1 10*3/uL (ref 0.0–0.5)
HCT: 44.4 % (ref 38.4–49.9)
HGB: 15.7 g/dL (ref 13.0–17.1)
LYMPH%: 42.6 % (ref 14.0–49.0)
MCH: 32.9 pg (ref 27.2–33.4)
MCHC: 35.4 g/dL (ref 32.0–36.0)
MCV: 93.1 fL (ref 79.3–98.0)
MONO#: 0.3 10*3/uL (ref 0.1–0.9)
MONO%: 11.7 % (ref 0.0–14.0)
NEUT#: 1.1 10*3/uL — ABNORMAL LOW (ref 1.5–6.5)
NEUT%: 40.4 % (ref 39.0–75.0)
Platelets: 224 10*3/uL (ref 140–400)
RBC: 4.77 10*6/uL (ref 4.20–5.82)
RDW: 11.5 % (ref 11.0–14.6)
WBC: 2.8 10*3/uL — ABNORMAL LOW (ref 4.0–10.3)
lymph#: 1.2 10*3/uL (ref 0.9–3.3)

## 2017-08-14 NOTE — Progress Notes (Signed)
Patient Care Team: Wenda Low, MD as PCP - General (Internal Medicine)  DIAGNOSIS:  Encounter Diagnosis  Name Primary?  . Other neutropenia ([place])    CHIEF COMPLIANT: Follow-up of chronic neutropenia  INTERVAL HISTORY: Jackson Buchanan is a 21 year old after Jackson Buchanan gentleman with chronic neutropenia were extensive workup including a bone marrow biopsy which did not reveal any major abnormalities other 1 suggesting that it is not immune phenomenon. He was seen at rheumatology clinic at [facility] and they did not diagnose him with any autoimmune disorder. She is being seen after a three-month follow-up. She reports no new concerns or complaints. 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  REVIEW OF SYSTEMS:   Constitutional: Denies fevers, chills or abnormal weight loss Eyes: Denies blurriness of vision Ears, nose, mouth, throat, and face: Denies mucositis or sore throat Respiratory: Denies cough, dyspnea or wheezes Cardiovascular: Denies palpitation, chest discomfort Gastrointestinal:  Denies nausea, heartburn or change in bowel habits Skin: Denies abnormal skin rashes Lymphatics: Denies new lymphadenopathy or easy bruising Neurological:Denies numbness, tingling or new weaknesses Behavioral/Psych: Mood is stable, no new changes  Extremities: No lower extremity edema All other systems were reviewed with the patient and are negative.  I have reviewed the past medical history, past surgical history, social history and family history with the patient and they are unchanged from previous note.  ALLERGIES:  has no allergies on file.  MEDICATIONS:  No current outpatient prescriptions on file.   No current facility-administered medications for this visit.     PHYSICAL EXAMINATION: ECOG PERFORMANCE STATUS: 1 - Symptomatic but completely ambulatory  Vitals:   08/14/17 1519  BP: 112/69  Pulse: 73  Resp: 18  Temp: 98.4 F (36.9 C)  SpO2: 100%   Filed Weights   08/14/17 1519    Weight: 134 lb 6.4 oz (61 kg)    GENERAL:alert, no distress and comfortable SKIN: skin color, texture, turgor are normal, no rashes or significant lesions EYES: normal, Conjunctiva are pink and non-injected, sclera clear OROPHARYNX:no exudate, no erythema and lips, buccal mucosa, and tongue normal  NECK: supple, thyroid normal size, non-tender, without nodularity LYMPH:  no palpable lymphadenopathy in the cervical, axillary or inguinal LUNGS: clear to auscultation and percussion with normal breathing effort HEART: regular rate & rhythm and no murmurs and no lower extremity edema ABDOMEN:abdomen soft, non-tender and normal bowel sounds MUSCULOSKELETAL:no cyanosis of digits and no clubbing  NEURO: alert & oriented x 3 with fluent speech, no focal motor/sensory deficits EXTREMITIES: No lower extremity edema  LABORATORY DATA:  I have reviewed the data as listed   Chemistry      Component Value Date/Time   NA 140 04/24/2017 1020   K 4.6 04/24/2017 1020   CO2 29 04/24/2017 1020   BUN 12.5 04/24/2017 1020   CREATININE 1.0 04/24/2017 1020      Component Value Date/Time   CALCIUM 9.6 04/24/2017 1020   ALKPHOS 80 04/24/2017 1020   AST 21 04/24/2017 1020   ALT 16 04/24/2017 1020   BILITOT 0.76 04/24/2017 1020       Lab Results  Component Value Date   WBC 2.8 (L) 08/14/2017   HGB 15.7 08/14/2017   HCT 44.4 08/14/2017   MCV 93.1 08/14/2017   PLT 224 08/14/2017   NEUTROABS 1.1 (L) 08/14/2017    ASSESSMENT & PLAN:  Neutropenia (HCC) Previous workup for neutropenia: 1. B-09: 628ZMO folic acid levels: 29.4; LDH: 138 2.ANA: Positive Titers 1:160 speckled pattern 3. Flow cytometry: Neg  4. HIV: Neg Rheumatology consultation: Did not think he has lupus  Bone Marrow Biopsy 04/25/17: The bone marrow is variably cellular (slightly hypocellular for age) with trilineage hematopoiesis but with relative abundance of erythroid precursors and apparently decreased granulocytic cells. The  latter however, displays progressive maturation including relative abundance of mature neutrophils. No significant dysgranulopoiesis or increase in blastic cells is identified. Megakaryocytes are abundant with scattered atypical forms. The overall myeloid changes are not considered specific and likely represent secondary changes  Lab review:Labs remained relatively unchanged ANC 1100  Discussion; Indicates auto immune process like lupus and no primary bone marrow disorder. However that if her blood work did not identify any autoimmune processes. Plan: Watchful monitoring with recheck of CBC in 6 months.  I spent 25 minutes talking to the patient of which more than half was spent in counseling and coordination of care.  No orders of the defined types were placed in this encounter.  The patient has a good understanding of the overall plan. he agrees with it. he will call with any problems that may develop before the next visit here.   Gudena, Vinay K, MD 08/14/17    

## 2017-08-14 NOTE — Assessment & Plan Note (Signed)
Previous workup for neutropenia: 1. T-00: 525LTG folic acid levels: 28.9; LDH: 138 2.ANA: Positive Titers 1:160 speckled pattern 3. Flow cytometry: Neg 4. HIV: Neg Rheumatology consultation: Did not think he has lupus  Bone Marrow Biopsy 04/25/17: The bone marrow is variably cellular (slightly hypocellular for age) with trilineage hematopoiesis but with relative abundance of erythroid precursors and apparently decreased granulocytic cells. The latter however, displays progressive maturation including relative abundance of mature neutrophils. No significant dysgranulopoiesis or increase in blastic cells is identified. Megakaryocytes are abundant with scattered atypical forms. The overall myeloid changes are not considered specific and likely represent secondary changes  Lab review:  Discussion; Indicates auto immune process like lupus and no primary bone marrow disorder. Plan: Watchful monitoring with recheck of CBC in 6 months.

## 2017-08-14 NOTE — Telephone Encounter (Signed)
Gave patient avs and calender for upcoming appointment. Per 9/24 los

## 2017-08-30 DIAGNOSIS — Z23 Encounter for immunization: Secondary | ICD-10-CM | POA: Diagnosis not present

## 2017-09-28 DIAGNOSIS — L819 Disorder of pigmentation, unspecified: Secondary | ICD-10-CM | POA: Diagnosis not present

## 2017-09-28 DIAGNOSIS — L700 Acne vulgaris: Secondary | ICD-10-CM | POA: Diagnosis not present

## 2017-11-27 DIAGNOSIS — Z136 Encounter for screening for cardiovascular disorders: Secondary | ICD-10-CM | POA: Diagnosis not present

## 2017-11-27 DIAGNOSIS — Z1389 Encounter for screening for other disorder: Secondary | ICD-10-CM | POA: Diagnosis not present

## 2017-11-27 DIAGNOSIS — Z0000 Encounter for general adult medical examination without abnormal findings: Secondary | ICD-10-CM | POA: Diagnosis not present

## 2017-12-19 DIAGNOSIS — D72819 Decreased white blood cell count, unspecified: Secondary | ICD-10-CM | POA: Diagnosis not present

## 2017-12-19 DIAGNOSIS — Z6820 Body mass index (BMI) 20.0-20.9, adult: Secondary | ICD-10-CM | POA: Diagnosis not present

## 2018-01-01 DIAGNOSIS — J301 Allergic rhinitis due to pollen: Secondary | ICD-10-CM | POA: Diagnosis not present

## 2018-01-01 DIAGNOSIS — J4530 Mild persistent asthma, uncomplicated: Secondary | ICD-10-CM | POA: Diagnosis not present

## 2018-01-01 DIAGNOSIS — L209 Atopic dermatitis, unspecified: Secondary | ICD-10-CM | POA: Diagnosis not present

## 2018-01-01 DIAGNOSIS — J3089 Other allergic rhinitis: Secondary | ICD-10-CM | POA: Diagnosis not present

## 2018-01-02 DIAGNOSIS — Z91018 Allergy to other foods: Secondary | ICD-10-CM | POA: Diagnosis not present

## 2018-01-03 ENCOUNTER — Telehealth: Payer: Self-pay | Admitting: Hematology and Oncology

## 2018-01-03 NOTE — Telephone Encounter (Signed)
Returned call to patient as he was requesting Lab Appt for 02/26/18.  He had to cancel last appointment.

## 2018-02-12 ENCOUNTER — Other Ambulatory Visit: Payer: BLUE CROSS/BLUE SHIELD

## 2018-02-26 ENCOUNTER — Telehealth: Payer: Self-pay | Admitting: Hematology and Oncology

## 2018-02-26 ENCOUNTER — Inpatient Hospital Stay: Payer: BLUE CROSS/BLUE SHIELD | Attending: Hematology and Oncology

## 2018-02-26 DIAGNOSIS — D709 Neutropenia, unspecified: Secondary | ICD-10-CM | POA: Insufficient documentation

## 2018-02-26 DIAGNOSIS — D708 Other neutropenia: Secondary | ICD-10-CM

## 2018-02-26 LAB — CBC WITH DIFFERENTIAL/PLATELET
Basophils Absolute: 0 10*3/uL (ref 0.0–0.1)
Basophils Relative: 1 %
Eosinophils Absolute: 0 10*3/uL (ref 0.0–0.5)
Eosinophils Relative: 1 %
HCT: 46.2 % (ref 38.4–49.9)
Hemoglobin: 15.9 g/dL (ref 13.0–17.1)
Lymphocytes Relative: 44 %
Lymphs Abs: 1.3 10*3/uL (ref 0.9–3.3)
MCH: 32.4 pg (ref 27.2–33.4)
MCHC: 34.5 g/dL (ref 32.0–36.0)
MCV: 94 fL (ref 79.3–98.0)
Monocytes Absolute: 0.4 10*3/uL (ref 0.1–0.9)
Monocytes Relative: 13 %
Neutro Abs: 1.2 10*3/uL — ABNORMAL LOW (ref 1.5–6.5)
Neutrophils Relative %: 41 %
Platelets: 241 10*3/uL (ref 140–400)
RBC: 4.91 MIL/uL (ref 4.20–5.82)
RDW: 11.5 % (ref 11.0–14.6)
WBC: 2.8 10*3/uL — ABNORMAL LOW (ref 4.0–10.3)

## 2018-02-26 NOTE — Telephone Encounter (Signed)
I informed the patient that his CBC is exactly the same as where it has been over the past 1 year. No indication to do any other investigations. Patient is asymptomatic. I will see him back in 1 year with labs and follow-up

## 2018-02-28 ENCOUNTER — Telehealth: Payer: Self-pay | Admitting: Hematology and Oncology

## 2018-02-28 NOTE — Telephone Encounter (Signed)
Mailed patient calendar of upcoming April 2020 appointments per 4/8 sch message

## 2018-03-08 DIAGNOSIS — J301 Allergic rhinitis due to pollen: Secondary | ICD-10-CM | POA: Diagnosis not present

## 2018-03-08 DIAGNOSIS — J4530 Mild persistent asthma, uncomplicated: Secondary | ICD-10-CM | POA: Diagnosis not present

## 2018-03-08 DIAGNOSIS — J3089 Other allergic rhinitis: Secondary | ICD-10-CM | POA: Diagnosis not present

## 2018-03-08 DIAGNOSIS — Z91018 Allergy to other foods: Secondary | ICD-10-CM | POA: Diagnosis not present

## 2018-08-10 DIAGNOSIS — H0016 Chalazion left eye, unspecified eyelid: Secondary | ICD-10-CM | POA: Diagnosis not present

## 2018-08-13 DIAGNOSIS — H00016 Hordeolum externum left eye, unspecified eyelid: Secondary | ICD-10-CM | POA: Diagnosis not present

## 2018-08-15 DIAGNOSIS — Z23 Encounter for immunization: Secondary | ICD-10-CM | POA: Diagnosis not present

## 2018-10-03 ENCOUNTER — Other Ambulatory Visit: Payer: Self-pay | Admitting: Internal Medicine

## 2018-10-03 DIAGNOSIS — R6250 Unspecified lack of expected normal physiological development in childhood: Secondary | ICD-10-CM

## 2018-10-03 DIAGNOSIS — R41840 Attention and concentration deficit: Secondary | ICD-10-CM | POA: Diagnosis not present

## 2018-10-03 DIAGNOSIS — F411 Generalized anxiety disorder: Secondary | ICD-10-CM | POA: Diagnosis not present

## 2018-10-17 ENCOUNTER — Ambulatory Visit
Admission: RE | Admit: 2018-10-17 | Discharge: 2018-10-17 | Disposition: A | Discharge status: Home / Self Care | Payer: BLUE CROSS/BLUE SHIELD | Source: Ambulatory Visit | Attending: Internal Medicine | Admitting: Internal Medicine

## 2018-10-17 DIAGNOSIS — R6250 Unspecified lack of expected normal physiological development in childhood: Secondary | ICD-10-CM

## 2018-11-07 DIAGNOSIS — R41840 Attention and concentration deficit: Secondary | ICD-10-CM | POA: Diagnosis not present

## 2018-11-07 DIAGNOSIS — D72819 Decreased white blood cell count, unspecified: Secondary | ICD-10-CM | POA: Diagnosis not present

## 2018-12-31 DIAGNOSIS — J301 Allergic rhinitis due to pollen: Secondary | ICD-10-CM | POA: Diagnosis not present

## 2018-12-31 DIAGNOSIS — J3089 Other allergic rhinitis: Secondary | ICD-10-CM | POA: Diagnosis not present

## 2018-12-31 DIAGNOSIS — L209 Atopic dermatitis, unspecified: Secondary | ICD-10-CM | POA: Diagnosis not present

## 2018-12-31 DIAGNOSIS — J4530 Mild persistent asthma, uncomplicated: Secondary | ICD-10-CM | POA: Diagnosis not present

## 2019-01-14 DIAGNOSIS — F840 Autistic disorder: Secondary | ICD-10-CM | POA: Diagnosis not present

## 2019-02-05 ENCOUNTER — Telehealth: Payer: Self-pay | Admitting: Hematology and Oncology

## 2019-02-05 NOTE — Telephone Encounter (Signed)
VG PAL 4/6 moved lab/fu to 4/7. Left message. Schedule mailed.

## 2019-02-25 ENCOUNTER — Other Ambulatory Visit: Payer: BLUE CROSS/BLUE SHIELD

## 2019-02-25 ENCOUNTER — Ambulatory Visit: Payer: BLUE CROSS/BLUE SHIELD | Admitting: Hematology and Oncology

## 2019-02-26 ENCOUNTER — Inpatient Hospital Stay: Payer: BLUE CROSS/BLUE SHIELD | Attending: Internal Medicine

## 2019-02-26 ENCOUNTER — Inpatient Hospital Stay: Payer: BLUE CROSS/BLUE SHIELD | Admitting: Hematology and Oncology

## 2019-02-26 NOTE — Assessment & Plan Note (Deleted)
Previous workup for neutropenia: 1. U-67: 519WYS folic acid levels: 29.9; LDH: 138 2.ANA: Positive Titers 1:160 speckled pattern 3. Flow cytometry: Neg 4. HIV: Neg Rheumatology consultation: Did not think he has lupus  Bone Marrow Biopsy 04/25/17: The bone marrow is variably cellular (slightly hypocellular for age) with trilineage hematopoiesis but with relative abundance of erythroid precursors and apparently decreased granulocytic cells. The latter however, displays progressive maturation including relative abundance of mature neutrophils. No significant dysgranulopoiesis or increase in blastic cells is identified. Megakaryocytes are abundant with scattered atypical forms. The overall myeloid changes are not considered specific and likely represent secondary changes  Lab review 02/26/2019:  Return to clinic in 1 year with labs and follow-up

## 2019-04-30 DIAGNOSIS — H0014 Chalazion left upper eyelid: Secondary | ICD-10-CM | POA: Diagnosis not present

## 2019-07-26 DIAGNOSIS — Z1159 Encounter for screening for other viral diseases: Secondary | ICD-10-CM | POA: Diagnosis not present

## 2019-07-26 DIAGNOSIS — Z20828 Contact with and (suspected) exposure to other viral communicable diseases: Secondary | ICD-10-CM | POA: Diagnosis not present

## 2019-07-26 DIAGNOSIS — R0981 Nasal congestion: Secondary | ICD-10-CM | POA: Diagnosis not present

## 2019-08-03 DIAGNOSIS — Z23 Encounter for immunization: Secondary | ICD-10-CM | POA: Diagnosis not present

## 2020-01-21 ENCOUNTER — Ambulatory Visit (INDEPENDENT_AMBULATORY_CARE_PROVIDER_SITE_OTHER): Payer: BC Managed Care – PPO | Admitting: Clinical

## 2020-01-21 DIAGNOSIS — F840 Autistic disorder: Secondary | ICD-10-CM

## 2020-02-03 ENCOUNTER — Ambulatory Visit (INDEPENDENT_AMBULATORY_CARE_PROVIDER_SITE_OTHER): Payer: BC Managed Care – PPO | Admitting: Clinical

## 2020-02-03 DIAGNOSIS — F840 Autistic disorder: Secondary | ICD-10-CM | POA: Diagnosis not present

## 2020-02-11 ENCOUNTER — Ambulatory Visit (INDEPENDENT_AMBULATORY_CARE_PROVIDER_SITE_OTHER): Payer: BC Managed Care – PPO | Admitting: Clinical

## 2020-02-11 DIAGNOSIS — F840 Autistic disorder: Secondary | ICD-10-CM

## 2020-03-31 ENCOUNTER — Ambulatory Visit: Payer: BC Managed Care – PPO | Admitting: Clinical

## 2020-04-01 ENCOUNTER — Ambulatory Visit (INDEPENDENT_AMBULATORY_CARE_PROVIDER_SITE_OTHER): Payer: BC Managed Care – PPO | Admitting: Clinical

## 2020-04-01 DIAGNOSIS — F840 Autistic disorder: Secondary | ICD-10-CM

## 2020-04-02 IMAGING — MR MR HEAD W/O CM
10 series · 48 of 48 positions shown · non-contrast
Comparison: None.

CLINICAL DATA: Development delay

EXAM:
MRI HEAD WITHOUT CONTRAST
TECHNIQUE: Multiplanar, multiecho pulse sequences of the brain and surrounding
structures were obtained without intravenous contrast.

[Series 2: T1 · sagittal · 5.0mm · 0.45mm/px · 1 of 24 slices shown]
[im 1/24]
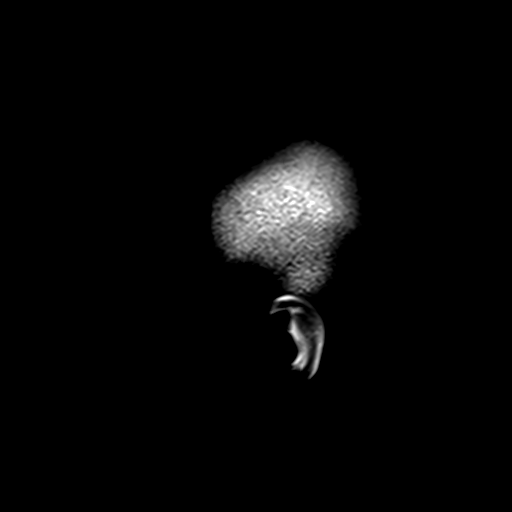

[Series 3: DWI · axial · 3.0mm · 1.80mm/px · z∈[-57,+94]mm · 9 of 104 slices shown (1 of 4)]
[im 1/104]
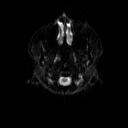
[im 13/104]
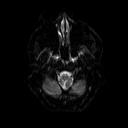
[im 26/104]
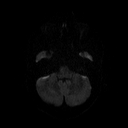
[im 39/104]
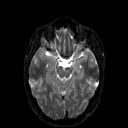
[im 52/104]
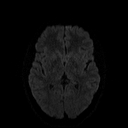
[im 65/104]
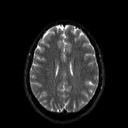
[im 78/104]
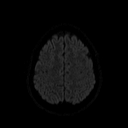
[im 91/104]
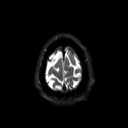
[im 104/104]
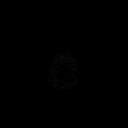

[Series 4: DWI · axial · 3.0mm · 1.80mm/px · z∈[-57,+94]mm · 4 of 52 slices shown (2 of 4)]
[im 1/52]
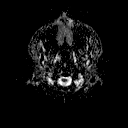
[im 18/52]
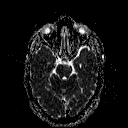
[im 35/52]
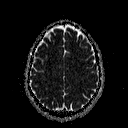
[im 52/52]
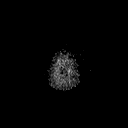

[Series 5: FLAIR · axial · 3.0mm · 0.45mm/px · z∈[-55,+92]mm · 3 of 33 slices shown]
[im 1/33]
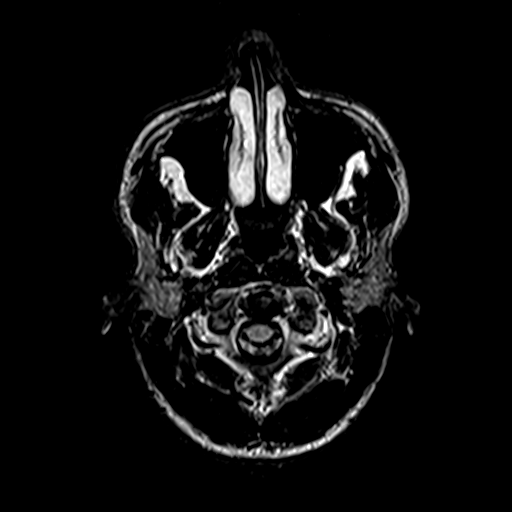
[im 17/33]
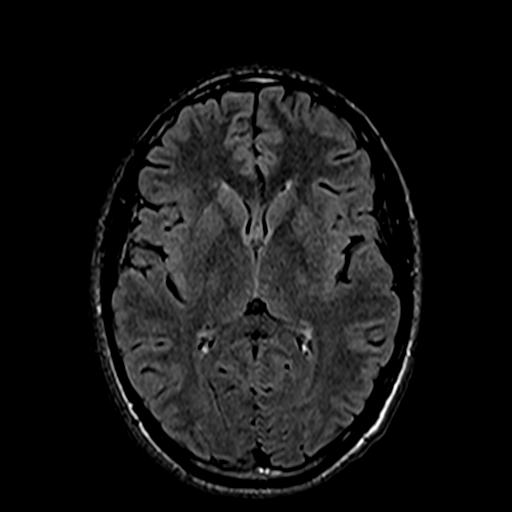
[im 33/33]
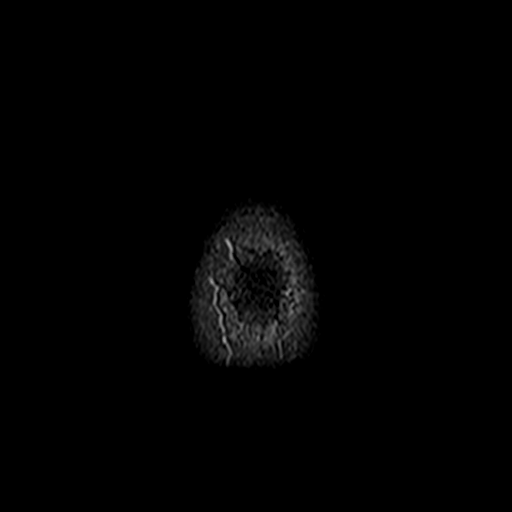

[Series 6: T2 · axial · 5.0mm · 0.60mm/px · z∈[-52,+95]mm · 2 of 23 slices shown (1 of 2)]
[im 1/23]
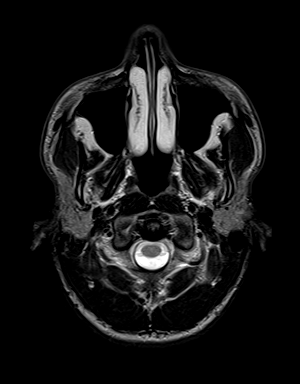
[im 23/23]
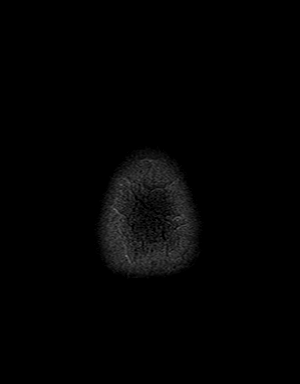

[Series 8: swi_images · axial · 5.0mm · 0.90mm/px · z∈[-52,+102]mm · 3 of 32 slices shown]
[im 1/32]
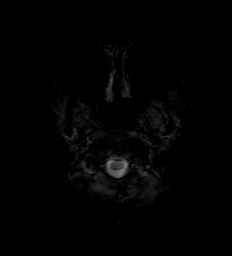
[im 16/32]
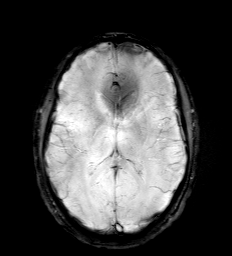
[im 32/32]
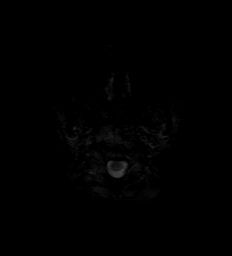

[Series 9: t1_mpr_tra · axial · 1.0mm · 0.72mm/px · z∈[-58,+100]mm · 13 of 160 slices shown]
[im 1/160]
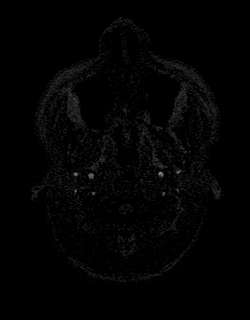
[im 14/160]
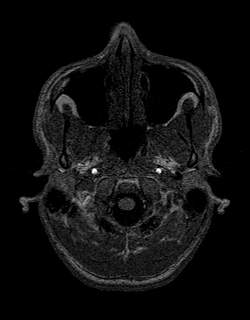
[im 27/160]
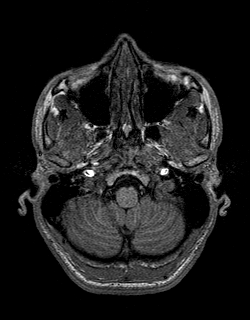
[im 40/160]
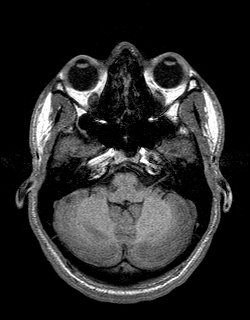
[im 54/160]
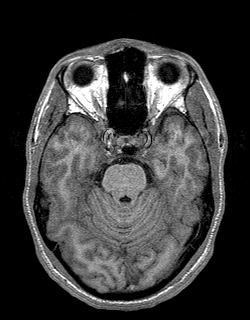
[im 67/160]
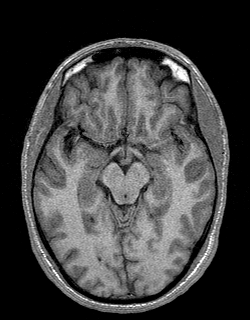
[im 80/160]
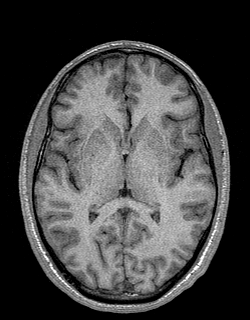
[im 93/160]
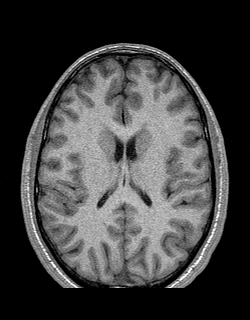
[im 107/160]
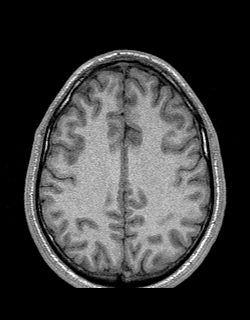
[im 120/160]
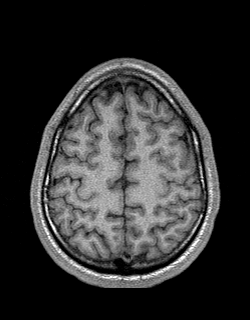
[im 133/160]
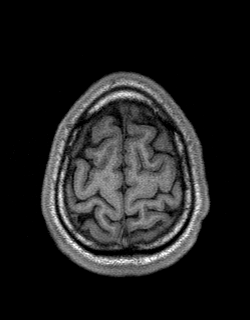
[im 146/160]
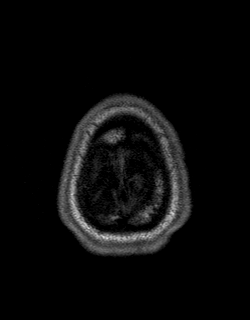
[im 160/160]
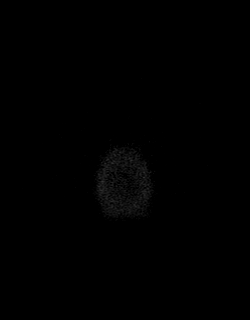

[Series 10: DWI · coronal · 5.0mm · 1.80mm/px · 7 of 78 slices shown (3 of 4)]
[im 1/78]
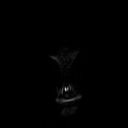
[im 13/78]
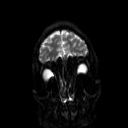
[im 26/78]
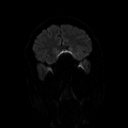
[im 39/78]
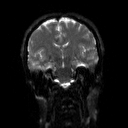
[im 52/78]
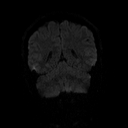
[im 65/78]
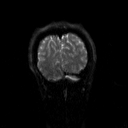
[im 78/78]
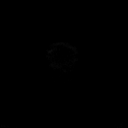

[Series 11: DWI · coronal · 5.0mm · 1.80mm/px · 3 of 39 slices shown (4 of 4)]
[im 1/39]
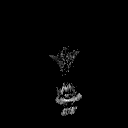
[im 20/39]
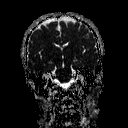
[im 39/39]
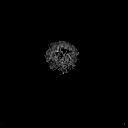

[Series 12: T2 · coronal · 5.0mm · 0.45mm/px · 3 of 30 slices shown (2 of 2)]
[im 1/30]
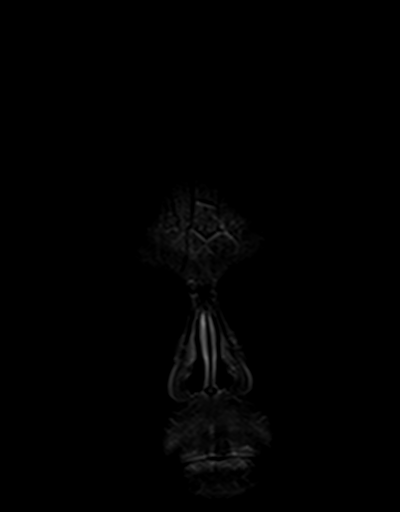
[im 15/30]
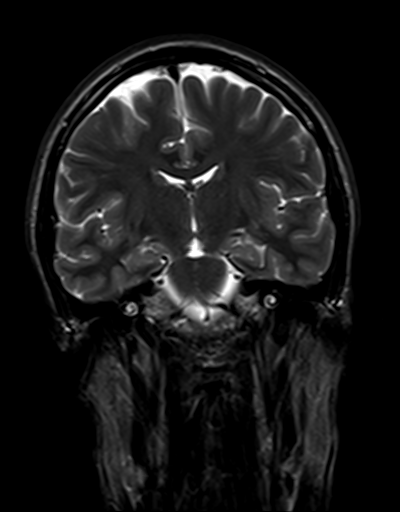
[im 30/30]
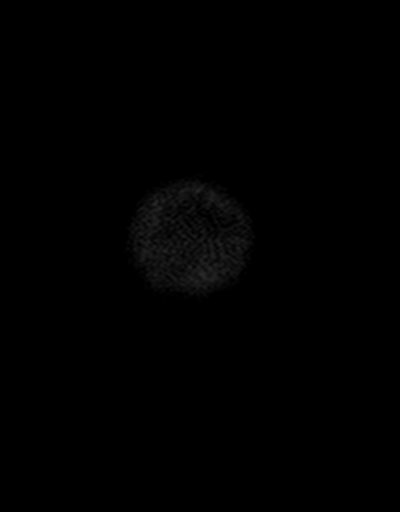

[48 of 48 positions shown; findings below may reference images not displayed]

FINDINGS: Brain: No acute infarction, hemorrhage, hydrocephalus, extra-axial
collection or mass lesion.

Vascular: Normal arterial flow voids

Skull and upper cervical spine: Negative

Sinuses/Orbits: Negative

Other: None
IMPRESSION: Normal MRI brain

## 2020-04-07 ENCOUNTER — Ambulatory Visit (INDEPENDENT_AMBULATORY_CARE_PROVIDER_SITE_OTHER): Payer: BC Managed Care – PPO | Admitting: Clinical

## 2020-04-07 DIAGNOSIS — F840 Autistic disorder: Secondary | ICD-10-CM

## 2020-04-14 ENCOUNTER — Ambulatory Visit (INDEPENDENT_AMBULATORY_CARE_PROVIDER_SITE_OTHER): Payer: BC Managed Care – PPO | Admitting: Clinical

## 2020-04-14 DIAGNOSIS — F840 Autistic disorder: Secondary | ICD-10-CM | POA: Diagnosis not present

## 2020-04-21 ENCOUNTER — Ambulatory Visit (INDEPENDENT_AMBULATORY_CARE_PROVIDER_SITE_OTHER): Payer: BC Managed Care – PPO | Admitting: Clinical

## 2020-04-21 DIAGNOSIS — F840 Autistic disorder: Secondary | ICD-10-CM | POA: Diagnosis not present

## 2020-04-28 ENCOUNTER — Ambulatory Visit: Payer: BC Managed Care – PPO | Admitting: Clinical

## 2020-05-05 ENCOUNTER — Ambulatory Visit: Payer: BC Managed Care – PPO | Admitting: Clinical

## 2020-05-12 ENCOUNTER — Ambulatory Visit: Payer: BC Managed Care – PPO | Admitting: Clinical

## 2020-05-19 ENCOUNTER — Ambulatory Visit: Payer: BC Managed Care – PPO | Admitting: Clinical

## 2020-05-26 ENCOUNTER — Ambulatory Visit: Payer: BC Managed Care – PPO | Admitting: Clinical

## 2020-06-02 ENCOUNTER — Ambulatory Visit: Payer: BC Managed Care – PPO | Admitting: Clinical

## 2020-08-21 DIAGNOSIS — L309 Dermatitis, unspecified: Secondary | ICD-10-CM | POA: Diagnosis not present

## 2020-08-21 DIAGNOSIS — Z0000 Encounter for general adult medical examination without abnormal findings: Secondary | ICD-10-CM | POA: Diagnosis not present

## 2020-08-21 DIAGNOSIS — Z13220 Encounter for screening for lipoid disorders: Secondary | ICD-10-CM | POA: Diagnosis not present

## 2020-08-21 DIAGNOSIS — Z23 Encounter for immunization: Secondary | ICD-10-CM | POA: Diagnosis not present

## 2020-08-21 DIAGNOSIS — J45909 Unspecified asthma, uncomplicated: Secondary | ICD-10-CM | POA: Diagnosis not present

## 2020-08-21 DIAGNOSIS — J309 Allergic rhinitis, unspecified: Secondary | ICD-10-CM | POA: Diagnosis not present

## 2020-09-03 DIAGNOSIS — F418 Other specified anxiety disorders: Secondary | ICD-10-CM | POA: Diagnosis not present

## 2020-10-05 ENCOUNTER — Ambulatory Visit (INDEPENDENT_AMBULATORY_CARE_PROVIDER_SITE_OTHER): Payer: BC Managed Care – PPO | Admitting: Clinical

## 2020-10-05 DIAGNOSIS — F840 Autistic disorder: Secondary | ICD-10-CM

## 2020-10-21 DIAGNOSIS — F418 Other specified anxiety disorders: Secondary | ICD-10-CM | POA: Diagnosis not present

## 2020-11-27 ENCOUNTER — Ambulatory Visit: Payer: Self-pay

## 2020-11-28 DIAGNOSIS — Z1152 Encounter for screening for COVID-19: Secondary | ICD-10-CM | POA: Diagnosis not present

## 2020-12-01 ENCOUNTER — Ambulatory Visit: Payer: Self-pay

## 2020-12-08 ENCOUNTER — Ambulatory Visit: Payer: Self-pay

## 2020-12-15 ENCOUNTER — Other Ambulatory Visit: Payer: Self-pay

## 2020-12-15 DIAGNOSIS — Z20822 Contact with and (suspected) exposure to covid-19: Secondary | ICD-10-CM

## 2020-12-16 LAB — SARS-COV-2, NAA 2 DAY TAT

## 2020-12-16 LAB — NOVEL CORONAVIRUS, NAA: SARS-CoV-2, NAA: NOT DETECTED

## 2020-12-22 ENCOUNTER — Ambulatory Visit: Payer: Self-pay | Attending: Internal Medicine

## 2020-12-22 DIAGNOSIS — Z23 Encounter for immunization: Secondary | ICD-10-CM

## 2020-12-22 NOTE — Progress Notes (Signed)
   Covid-19 Vaccination Clinic  Name:  Jackson Buchanan    MRN: [id number] DOB: May 21, 1996  12/22/2020  [name] was observed post Covid-19 immunization for 15 minutes without incident. He was provided with Vaccine Information Sheet and instruction to access the V-Safe system.   [name] was instructed to call 911 with any severe reactions post vaccine: Marland Kitchen Difficulty breathing  . Swelling of face and throat  . A fast heartbeat  . A bad rash all over body  . Dizziness and weakness   Immunizations Administered    Name Date Dose VIS Date Route   PFIZER Comrnaty(Gray TOP) Covid-19 Vaccine 12/22/2020  2:21 PM 0.3 mL 10/29/2020 Intramuscular   Manufacturer: [employer], [employer]   Lot: [id number]   NDC: [phone]

## 2021-04-13 DIAGNOSIS — F418 Other specified anxiety disorders: Secondary | ICD-10-CM | POA: Diagnosis not present

## 2021-04-13 DIAGNOSIS — L309 Dermatitis, unspecified: Secondary | ICD-10-CM | POA: Diagnosis not present

## 2021-04-13 DIAGNOSIS — D72819 Decreased white blood cell count, unspecified: Secondary | ICD-10-CM | POA: Diagnosis not present

## 2021-04-13 DIAGNOSIS — J45909 Unspecified asthma, uncomplicated: Secondary | ICD-10-CM | POA: Diagnosis not present

## 2021-04-26 DIAGNOSIS — R5383 Other fatigue: Secondary | ICD-10-CM | POA: Diagnosis not present

## 2021-08-03 DIAGNOSIS — H35463 Secondary vitreoretinal degeneration, bilateral: Secondary | ICD-10-CM | POA: Diagnosis not present

## 2021-08-03 DIAGNOSIS — H35412 Lattice degeneration of retina, left eye: Secondary | ICD-10-CM | POA: Diagnosis not present

## 2021-08-03 DIAGNOSIS — H4423 Degenerative myopia, bilateral: Secondary | ICD-10-CM | POA: Diagnosis not present

## 2021-09-16 DIAGNOSIS — Z23 Encounter for immunization: Secondary | ICD-10-CM | POA: Diagnosis not present

## 2021-09-16 DIAGNOSIS — Z0000 Encounter for general adult medical examination without abnormal findings: Secondary | ICD-10-CM | POA: Diagnosis not present

## 2022-06-14 DIAGNOSIS — Z0000 Encounter for general adult medical examination without abnormal findings: Secondary | ICD-10-CM | POA: Diagnosis not present

## 2022-06-14 DIAGNOSIS — E559 Vitamin D deficiency, unspecified: Secondary | ICD-10-CM | POA: Diagnosis not present

## 2022-06-14 DIAGNOSIS — Z13220 Encounter for screening for lipoid disorders: Secondary | ICD-10-CM | POA: Diagnosis not present

## 2022-06-14 DIAGNOSIS — D72819 Decreased white blood cell count, unspecified: Secondary | ICD-10-CM | POA: Diagnosis not present

## 2022-12-12 DIAGNOSIS — F418 Other specified anxiety disorders: Secondary | ICD-10-CM | POA: Diagnosis not present

## 2022-12-12 DIAGNOSIS — E559 Vitamin D deficiency, unspecified: Secondary | ICD-10-CM | POA: Diagnosis not present

## 2022-12-12 DIAGNOSIS — J309 Allergic rhinitis, unspecified: Secondary | ICD-10-CM | POA: Diagnosis not present

## 2022-12-12 DIAGNOSIS — L309 Dermatitis, unspecified: Secondary | ICD-10-CM | POA: Diagnosis not present

## 2022-12-25 ENCOUNTER — Encounter (HOSPITAL_BASED_OUTPATIENT_CLINIC_OR_DEPARTMENT_OTHER): Payer: Self-pay

## 2022-12-25 ENCOUNTER — Other Ambulatory Visit: Payer: Self-pay

## 2022-12-25 ENCOUNTER — Emergency Department (HOSPITAL_BASED_OUTPATIENT_CLINIC_OR_DEPARTMENT_OTHER)
Admission: EM | Admit: 2022-12-25 | Discharge: 2022-12-25 | Disposition: A | Discharge status: Home / Self Care | Payer: BC Managed Care – PPO | Attending: Emergency Medicine | Admitting: Emergency Medicine

## 2022-12-25 DIAGNOSIS — U071 COVID-19: Secondary | ICD-10-CM

## 2022-12-25 DIAGNOSIS — R0981 Nasal congestion: Secondary | ICD-10-CM | POA: Diagnosis not present

## 2022-12-25 HISTORY — DX: Dermatitis, unspecified: L30.9

## 2022-12-25 HISTORY — DX: Unspecified asthma, uncomplicated: J45.909

## 2022-12-25 LAB — RESP PANEL BY RT-PCR (RSV, FLU A&B, COVID)  RVPGX2
Influenza A by PCR: NEGATIVE
Influenza B by PCR: NEGATIVE
Resp Syncytial Virus by PCR: NEGATIVE
SARS Coronavirus 2 by RT PCR: POSITIVE — AB

## 2022-12-25 MED ORDER — IBUPROFEN 800 MG PO TABS
800.0000 mg | ORAL_TABLET | Freq: Once | ORAL | Status: AC
  Administered 2022-12-25: 800 mg via ORAL
  Filled 2022-12-25: qty 1

## 2022-12-25 MED ORDER — ACETAMINOPHEN 325 MG PO TABS
650.0000 mg | ORAL_TABLET | Freq: Once | ORAL | Status: AC
  Administered 2022-12-25: 650 mg via ORAL
  Filled 2022-12-25: qty 2

## 2022-12-25 MED ORDER — PAXLOVID (300/100) 20 X 150 MG & 10 X 100MG PO TBPK: 3.0000 | ORAL_TABLET | Freq: Two times a day (BID) | ORAL | 0 refills | Status: AC

## 2022-12-25 NOTE — ED Notes (Signed)
EDP at [facility] 

## 2022-12-25 NOTE — Discharge Instructions (Signed)
You are diagnosed with COVID-19 virus.  Please rest, increase oral hydration, increase vitamin C intake including lemons, oranges, supplements, etc.  Antiviral prescription called Paxlovid has been sent to your pharmacy.  Common side effect of this medication is a iron like taste in the mouth.   Follow CDC guidelines on quarantine and isolation.  Wear an N95. Surgical masks do not prevent the spread of COVID.

## 2022-12-25 NOTE — ED Provider Notes (Addendum)
[facility] Provider Note   CSN: [id number] Arrival date & time: 12/25/22  4098     History  Chief Complaint  Patient presents with   Headache   Nasal Congestion    Jackson Buchanan is a 27 y.o. male.  Patient is a 27 year old male presenting for nasal congestion, sinus pressure, headache, and ear pain.  Patient states symptoms started approximately 2 days ago on Friday.  Presented to ED with fever of 100.3 F.  Sick contact of father at home.  Denies neck stiffness or neck rigidity.  The history is provided by the patient. No language interpreter was used.  Headache Associated symptoms: congestion, ear pain and fever   Associated symptoms: no abdominal pain, no back pain, no cough, no eye pain, no seizures, no sore throat and no vomiting        Home Medications Prior to Admission medications   Medication Sig Start Date End Date Taking? Authorizing Provider  nirmatrelvir & ritonavir (PAXLOVID, 300/100,) 20 x 150 MG & 10 x 100MG  TBPK Take 3 tablets by mouth 2 (two) times daily for 5 days. 12/25/22 11/21/89 Yes Lianne Cure, DO      Allergies    Patient has no allergy information on record.    Review of Systems   Review of Systems  Constitutional:  Positive for fever. Negative for chills.  HENT:  Positive for congestion, ear pain and sinus pain. Negative for sore throat.   Eyes:  Negative for pain and visual disturbance.  Respiratory:  Negative for cough and shortness of breath.   Cardiovascular:  Negative for chest pain and palpitations.  Gastrointestinal:  Negative for abdominal pain and vomiting.  Genitourinary:  Negative for dysuria and hematuria.  Musculoskeletal:  Negative for arthralgias and back pain.  Skin:  Negative for color change and rash.  Neurological:  Positive for headaches. Negative for seizures and syncope.  All other systems reviewed and are negative.   Physical Exam Updated Vital Signs BP 114/84 (BP  Location: Right Arm)   Pulse 94   Temp 100.3 F (37.9 C) (Oral)   Resp 12   Ht 5\' 7"  (1.702 m)   Wt 63.5 kg   SpO2 97%   BMI 21.93 kg/m  Physical Exam Vitals and nursing note reviewed.  Constitutional:      General: Jackson Buchanan is not in acute distress.    Appearance: Jackson Buchanan is well-developed.  HENT:     Head: Normocephalic and atraumatic.     Right Ear: Tympanic membrane and ear canal normal.     Left Ear: Tympanic membrane and ear canal normal.     Nose: Congestion present.     Mouth/Throat:     Lips: Pink.     Mouth: Mucous membranes are moist.     Pharynx: Posterior oropharyngeal erythema present.     Comments: Cobblestoning of the oral pharynx present Eyes:     Conjunctiva/sclera: Conjunctivae normal.  Cardiovascular:     Rate and Rhythm: Normal rate and regular rhythm.     Heart sounds: No murmur heard. Pulmonary:     Effort: Pulmonary effort is normal. No respiratory distress.     Breath sounds: Normal breath sounds.  Abdominal:     Palpations: Abdomen is soft.     Tenderness: There is no abdominal tenderness.  Musculoskeletal:        General: No swelling.     Cervical back: Neck supple.  Skin:    General: Skin is  warm and dry.     Capillary Refill: Capillary refill takes less than 2 seconds.  Neurological:     Mental Status: Jackson Buchanan is alert.  Psychiatric:        Mood and Affect: Mood normal.     ED Results / Procedures / Treatments   Labs (all labs ordered are listed, but only abnormal results are displayed) Labs Reviewed  RESP PANEL BY RT-PCR (RSV, FLU A&B, COVID)  RVPGX2 - Abnormal; Notable for the following components:      Result Value   SARS Coronavirus 2 by RT PCR POSITIVE (*)    All other components within normal limits    EKG None  Radiology No results found.  Procedures Procedures    Medications Ordered in ED Medications  ibuprofen (ADVIL) tablet 800 mg (800 mg Oral Given 12/25/22 0917)  acetaminophen (TYLENOL) tablet 650 mg (650 mg Oral Given  12/25/22 6659)    ED Course/ Medical Decision Making/ A&P                             Medical Decision Making Risk OTC drugs. Prescription drug management.   94:32 AM  27 year old male presenting for nasal congestion, sinus pressure, headache, and ear pain.  Patient is alert oriented x 3, no acute distress, febrile at 100.3 F, with otherwise stable vital signs.  Physical exam demonstrates nasal congestion and cobblestoning of the oropharynx.  No otitis media or otitis externa.  PCR positive for COVID-19 virus.  Patient given Motrin and Tylenol for symptomatic management of symptoms.  Already taking Afrin for nasal congestion.  Recommend rest, hydration, and Paxlovid.  Prescription sent to pharmacy.  No respiratory issues.  No hypoxia.  No signs or symptoms of sepsis.  Patient in no distress and overall condition improved here in the ED. Detailed discussions were had with the patient regarding current findings, and need for close f/u with PCP or on call doctor. The patient has been instructed to return immediately if the symptoms worsen in any way for re-evaluation. Patient verbalized understanding and is in agreement with current care plan. All questions answered prior to discharge.         Final Clinical Impression(s) / ED Diagnoses Final diagnoses:  COVID    Rx / DC Orders ED Discharge Orders          Ordered    nirmatrelvir & ritonavir (PAXLOVID, 300/100,) 20 x 150 MG & 10 x 100MG  TBPK  2 times daily        12/25/22 9357              Lianne Cure, DO 01/77/93 9030    Lianne Cure, DO 08/13/29 [phone]

## 2022-12-25 NOTE — ED Triage Notes (Signed)
Pt presents POV from home, ambulatory  Pt reports head pressure, sinus pressure, runny nose, nasal congestion and headache behind his eyes since Friday

## 2023-02-07 ENCOUNTER — Encounter: Payer: BC Managed Care – PPO | Attending: Psychology | Admitting: Psychology

## 2023-02-07 DIAGNOSIS — F819 Developmental disorder of scholastic skills, unspecified: Secondary | ICD-10-CM | POA: Insufficient documentation

## 2023-02-07 DIAGNOSIS — F09 Unspecified mental disorder due to known physiological condition: Secondary | ICD-10-CM | POA: Diagnosis not present

## 2023-03-30 ENCOUNTER — Encounter: Payer: BC Managed Care – PPO | Attending: Psychology

## 2023-03-30 DIAGNOSIS — F819 Developmental disorder of scholastic skills, unspecified: Secondary | ICD-10-CM | POA: Insufficient documentation

## 2023-03-30 DIAGNOSIS — F09 Unspecified mental disorder due to known physiological condition: Secondary | ICD-10-CM | POA: Insufficient documentation

## 2023-03-31 DIAGNOSIS — F330 Major depressive disorder, recurrent, mild: Secondary | ICD-10-CM | POA: Diagnosis not present

## 2023-04-04 NOTE — Progress Notes (Signed)
Behavioral Observations:  The patient appeared well-groomed and appropriately dressed. His manners were polite and appropriate to the situation. The patient's attitude towards testing was positive and his effort was good.   Neuropsychology Note  Jackson Buchanan completed 190 minutes of neuropsychological testing with technician, Staci Acosta, BA, under the supervision of Arley Phenix, PsyD., Clinical Neuropsychologist. The patient did not appear overtly distressed by the testing session, per behavioral observation or via self-report to the technician. Rest breaks were offered.   Clinical Decision Making: In considering the patient's current level of functioning, level of presumed impairment, nature of symptoms, emotional and behavioral responses during clinical interview, level of literacy, and observed level of motivation/effort, a battery of tests was selected by Dr. Kieth Brightly during initial consultation on 02/07/2023. This was communicated to the technician. Communication between the neuropsychologist and technician was ongoing throughout the testing session and changes were made as deemed necessary based on patient performance on testing, technician observations and additional pertinent factors such as those listed above.  Tests Administered: Comprehensive Attention Battery (CAB) Continuous Performance Test (CPT) Delis Arlyce Dice Executive Functioning System (D-KEFS), Verbal Fluency Test Finger Tapping Test (FTT) Grooved Pegboard Trail Making Test (TMT; Part A & B) Wechsler Adult Intelligence Scale, 4th Edition (WAIS-IV)  Results:  D-KEFS Verbal Fluency:  Verbal Fluency Test (Standard) Letter Fluency total correct scaled score  10            Verbal Fluency Test (Standard) Category Fluency total correct scaled score 8          Verbal Fluency Test (Standard) Category Switching total correct scaled score 9           Verbal Fluency Test (Standard) Switching accuracy total correct  scaled score 10  Finger Tapping Test (FTT):  R (DH) Average= 50.2  Percentile Rank= 43rd  L (NDH) Average= 39.8  Percentile Rank= 34th  Grooved Pegboard:  R (DH) time= 95s Percentile Rank= 23rd  L (NDH) time= 87s  Percentile Rank= 29th  WAIS-IV:  Composite Score Summary  Scale Sum of Scaled Scores Composite Score Percentile Rank 95% Conf. Interval Qualitative Description  Verbal Comprehension 40 VCI 118 88 112-123 High Average  Perceptual Reasoning 33 PRI 105 63 99-111 Average  Working Memory 32 WMI 133 99 124-138 Very Superior  Processing Speed 13 PSI 81 10 75-91 Low Average  Full Scale 118 FSIQ 112 79 108-116 High Average  General Ability 73 GAI 113 81 108-118 High Average   Perceptual Reasoning Subtests Summary  Subtest Raw Score Scaled Score Percentile Rank Reference Group Scaled Score SEM  Block Design 40 9 37 9 0.95  Matrix Reasoning 20 11 63 11 0.90  Visual Puzzles 20 13 84 13 0.90  (Figure Weights) 20 12 75 12 0.90  (Picture Completion) 14 10 50 10 1.27   Working Librarian, academic Raw Score Scaled Score Percentile Rank Reference Group Scaled Score SEM  Digit Span 38 15 95 15 0.73  Arithmetic 21 17 99 17 0.99  (Letter-Number Seq.) 23 12 75 12 1.04   Processing Speed Subtests Summary  Subtest Raw Score Scaled Score Percentile Rank Reference Group Scaled Score SEM  Symbol Search 24 7 16 7  1.31  Coding 48 6 9 6  1.16  (Cancellation) 16 2 0.4 2 1.31    Feedback to Patient: Jackson Buchanan will return on 11/09/2023 for an interactive feedback session with Dr. Kieth Brightly at which time his test performances, clinical impressions and treatment recommendations will be reviewed in  detail. The patient understands he can contact our office should he require our assistance before this time.  190 minutes spent face-to-face with patient administering standardized tests, 30 minutes spent scoring Radiographer, therapeutic). [CPT [id number], [id number]]  Full report to follow.

## 2023-04-19 ENCOUNTER — Encounter: Payer: Self-pay | Admitting: Psychology

## 2023-04-19 NOTE — Progress Notes (Signed)
Neuropsychological Consultation   Patient:   Jackson Buchanan   DOB:   03-29-1996  MR Number:  [id number]  Location:  [facility] FOR PAIN AND [facility] MEDICINE [facility] PHYSICAL MEDICINE & REHABILITATION [address] [place], [address] [id number] [facility] [place] [address] Dept: [phone]           Date of Service:   02/07/2023  Location of Service and Individuals present: Today's visit was conducted in my outpatient clinic office with the patient, his mother and myself present for this evaluation.  1 hour and 15 minutes was spent in face-to-face clinical interview and the other 45 minutes were spent with records review, report writing and setting up testing protocols.  Start Time:   8 AM End Time:   10 AM  Patient Consent and Confidentiality: Limits of confidentiality were discussed including the request for neuropsychological evaluation by the patient's primary care physician Kyra Searles, Jackson Buchanan due to ongoing cognitive/information processing disorder and symptoms consistent with moderate depression and anxiety.  This report will be produced and made available in the patient's EMR as well as forwarded to his PCP.  Consent for Evaluation and Treatment:  Signed:  Yes Explanation of Privacy Policies:  Signed:  Yes Discussion of Confidentiality Limits:  Yes  Provider/Observer:  Arley Phenix, Psy.D.       Clinical Neuropsychologist       Billing Code/Service: [phone]  Chief Complaint:     Chief Complaint  Patient presents with   Depression    Expressive language difficulties   Anxiety    Expressive language difficulties and information processing speed difficulties    Reason for Service:    Jackson Buchanan is a 27 year old primarily right-handed (does some behaviors left-handed including throwing a ball and winding his watch) male referred for neuropsychological evaluation by his treating physician Darrar Duane Lope, Jackson Buchanan due to ongoing cognitive issues primarily around  information processing speed along with depression and anxiety.  There have been workups by rheumatology in the past (2018/2019) with concerns about potential autoimmune condition and nor lupus or some other condition.  Studies did not identify any clear finding of abnormalities beyond positive ANA measures and consistency with long-term low white blood cell counts.  During the clinical interview today the patient acknowledges longstanding difficulties with information processing speed and getting worked on with significant reduction in output speed when engaged in activities.  He acknowledged symptoms of depression and continues to take Lexapro without apparent side effects and some benefit.  The patient's mother acknowledges clear patterns of slowed information processing speed and significant delays in physical output.  Patient's mother reports that they have a particular interest to find out if there are any neurological abnormalities or injuries which may have contributed to his anxiety depression and slow output/reduced information processing and want to know what might be able to be done to help with the symptoms.  Primary issues are moderate clinical depression, information processing difficulties and disorder with significant impact on his work capacity and difficulty dealing with sadness and anger.  During the clinical interview today the patient had clear difficulties articulating what he was thinking and was slow in appearance of processing information.  When looking back at history, the patient's mother reports that the patient was talking fine and initially doing well in school and the patient knew a lot of information.  The patient began developing symptoms consistent with depression and when he was given assignments in school took an excessively long time but when  he completed his work they were done to perfection.  In preschool the patient was quite social and enjoys singing and was very smart  and well-spoken with an advanced vocabulary.  When school began placing increasing demands to get his work done on time he began having significant difficulties.  As volume of schoolwork increased his difficulties with completing work became more more evident.  However, whenever he did complete assignments they were always done very well and almost to perfection.  A major issue of note includes a potential head injury/neck injury when he was a very small child.  The patient is reported to have fallen down the stairs as a small child without apparent injury at the time.  Patient continues to deal with significant depression that has been severe at times over the past 2 to 3 years.  He continues to take citalopram.  The patient left college and has been unable to maintain effective school or occupational work patterns.  The patient's mother is just trying to find out what could be done to help him and to get support systems in place as he has been struggling going to school and working.  While I do not have previous evaluations available patient's mother reports that at 1 point the patient was diagnosed with all autistic spectrum disorder and the patient had some significant obsessional thinking about how we will complete task with intrusive thoughts and he has had a hard time being around people and often gets treated poorly by others.  Patient has longstanding difficulties regulating his emotions with significant depressive symptoms, difficulties in information processing speed with getting fixated on some aspects.  Medical History:   Past Medical History:  Diagnosis Date   Asthma    Eczema          Patient Active Problem List   Diagnosis Date Noted   Neutropenia (HCC) 01/09/2017    Onset and Duration of Symptoms: Patient started out with good social patterns and behaviors early in school but as demands increased and he continued to progress to school they became more and more evident.  Patient  has struggled since elementary school and has progressively withdrawn with greater difficulties in communication skills.  Associated Symptoms (e.g., cognitive, emotional, behavioral): Patient has significant interpersonal/relationship challenges and has had some negative experiences with other people being unkind or unfriendly to him due to his behavioral and social interaction patterns.  Additional Tests and Measures from other records:  Neuroimaging Results: Patient had an MRI head without contrast conducted on 10/17/2018 due to concerns around developmental delays and cognitive concerns.  The impression produced by Jackson Palau, Jackson Buchanan was of a normal MRI of brain with no acute infarction, hemorrhage, hydrocephalus or indications of acute or chronic abnormalities.  Laboratory Tests: Patient has had workup by [facility] healthcare rheumatology department in 2018 for evaluation of potential leukopenia in the setting of positive ANA.  Patient has had chronic low white blood cell count for years.  Patient has also had bone marrow biopsy [facility] in 2018.  While the patient had a positive ANA other serology studies were negative for abnormalities.  There were no indications of connective tissue disease or other issues.  Current Typical Mood State:  Depressed, Ambivalent, Anhedonia, Despair, Helpless, and Preoccupied  Sleep: Patient is described by himself and his mother is sleeping more than his normal.  Patient goes to bed and sleeps between 9 and 12 hours a day.  The patient reports that when he does go to  sleep and wake up it can be very erratic with nose consistent schedule.  Diet Pattern: Patient is described as having a low appetite since depression started intensity 2 meals a day and sometimes 1.  The patient is a rather picky eater.  Behavioral Observation/Mental Status:   Jackson Buchanan  presents as a 27 y.o.-year-old Ambidextrous but primary right-handed handed African American Male who appeared  his stated age. his dress was Appropriate and he was Well Groomed and his manners were Appropriate to the situation.  his participation was indicative of Redirectable behaviors.  There were not physical disabilities noted.  he displayed an appropriate level of cooperation and motivation.    Interactions:    Active Attentive  Attention:   within normal limits and attention span and concentration were age appropriate  Memory:   within normal limits; recent and remote memory intact  Visuo-spatial:   not examined  Speech (Volume):  low  Speech:   normal; very slowed response style/pattern  Thought Process:  Coherent and Relevant  Coherent, Directed, and Logical  Though Content:  WNL; not suicidal and not homicidal  Orientation:   person, place, time/date, and situation  Judgment:   Fair  Planning:   Fair  Affect:    Depressed  Mood:    Dysphoric  Insight:   Fair  Intelligence:   high  Marital Status/Living:  Patient was born and raised in [place] along with 2 siblings.  Patient began showing difficulties and early childhood with lack of interest in socialization, inability to get schoolwork done, being usually sad with difficulties coping with emotions and feelings.  Patient currently lives with his parents and is not married and has no children.  Educational and Occupational History:     Highest Level of Education:   Patient is currently a sophomore in college and is maintaining a 3.3 GPA but is no longer taking classes at this time.  Patient graduated from [employer] and also attended private schools and has been attending [address] [employer] classes.  His best subject is always in math and history of type classes and struggled relatively speaking in [place] classes.  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  He did this job for 4 months.  While the patient has never been terminated from her job he has quit many times with various jobs that he is attempted to hold.  Hobbies and Interests: Cooking, reading, watching movies and playing video games/chest.  Impact of Symptoms on Work or School:  Patient has not been able to maintain his educational experience and is currently not taking classes.  Impact of Symptoms on Social Functioning and Interpersonal Relationships: Patient tends to avoid others and is struggled with interpersonal interactions.   Psychiatric History:  Patient has a long history of depression and anxiety type symptoms and continues to take Lexapro without apparent side effects and some benefits.  No specific abuse or trauma is noted although the patient has had a number of negative interpersonal interactions with others and tends to avoid social interaction.  History of Substance Use or Abuse:  No concerns of substance abuse are reported.  Impression/DX:   Jackson Buchanan is a 27 year old primarily right-handed (does some behaviors left-handed including throwing a ball and winding his watch) male referred for neuropsychological evaluation by his  treating physician Darrar Duane Lope, Jackson Buchanan due to ongoing cognitive issues primarily around information processing speed along with depression and anxiety.  There have been workups by rheumatology in the past (2018/2019) with concerns about potential autoimmune condition and nor lupus or some other condition.  Studies did not identify any clear finding of abnormalities beyond positive ANA measures and consistency with long-term low white blood cell counts.  Disposition/Plan:  We have set the patient up for formal neuropsychological assessment and he will complete a foundational battery including the Wechsler Adult Intelligence Scale's, the comprehensive attention battery, DKEF, and the finger tapping test and grooved pegboard test.  Once  these are completed a formal neuropsychological evaluation will be produced with feedback to the patient and his parents as well as being made available in the patient's EMR for appropriate medical professionals to review.  Specific recommendations for medical providers will be noted in report and more detailed recommendations will be made to the patient and his parents in face-to-face feedback session.  Diagnosis:    Cognitive and neurobehavioral dysfunction        Note: This document was prepared using Dragon voice recognition software and may include unintentional dictation errors.   Electronically Signed   _______________________ Arley Phenix, Psy.D. Clinical Neuropsychologist

## 2023-04-20 DIAGNOSIS — F330 Major depressive disorder, recurrent, mild: Secondary | ICD-10-CM | POA: Diagnosis not present

## 2023-05-11 DIAGNOSIS — F330 Major depressive disorder, recurrent, mild: Secondary | ICD-10-CM | POA: Diagnosis not present

## 2023-05-12 DIAGNOSIS — F330 Major depressive disorder, recurrent, mild: Secondary | ICD-10-CM | POA: Diagnosis not present

## 2023-06-19 DIAGNOSIS — Z0000 Encounter for general adult medical examination without abnormal findings: Secondary | ICD-10-CM | POA: Diagnosis not present

## 2023-06-19 DIAGNOSIS — E559 Vitamin D deficiency, unspecified: Secondary | ICD-10-CM | POA: Diagnosis not present

## 2023-06-30 DIAGNOSIS — F330 Major depressive disorder, recurrent, mild: Secondary | ICD-10-CM | POA: Diagnosis not present

## 2023-11-07 ENCOUNTER — Encounter: Payer: Self-pay | Admitting: Psychology

## 2023-11-07 ENCOUNTER — Encounter: Payer: BC Managed Care – PPO | Attending: Psychology | Admitting: Psychology

## 2023-11-07 DIAGNOSIS — F840 Autistic disorder: Secondary | ICD-10-CM | POA: Diagnosis not present

## 2023-11-07 DIAGNOSIS — F331 Major depressive disorder, recurrent, moderate: Secondary | ICD-10-CM | POA: Insufficient documentation

## 2023-11-07 DIAGNOSIS — F422 Mixed obsessional thoughts and acts: Secondary | ICD-10-CM | POA: Insufficient documentation

## 2023-11-07 NOTE — Progress Notes (Signed)
Neuropsychological Evaluation   Patient:  Jackson Buchanan   DOB: 22-Oct-1996  MR Number: [id number]  Location: [facility] A DEPT OF [facility] [address] [place], [place] [address] [place] [address] Dept: [phone]  Start: 8 AM End: 9 AM  Provider/Observer:     Hershal Coria PsyD  Chief Complaint:      Chief Complaint  Patient presents with   Depression   Anxiety   Other    Expressive language difficulties and information processing speed difficulties    Reason For Service:     Jackson Buchanan is a 27 year old primarily right-handed (does some behaviors left-handed including throwing a ball and winding his watch) male referred for neuropsychological evaluation by his treating physician Darrar Duane Lope, MD due to ongoing cognitive issues primarily around information processing speed along with depression and anxiety.  There have been workups by rheumatology in the past (2018/2019) with concerns about potential autoimmune condition and/or lupus or some other condition.  Studies did not identify any clear finding of abnormalities beyond positive ANA measures and consistency with long-term low white blood cell counts.   During the clinical interview today, the patient acknowledges longstanding difficulties with information processing speed and getting work done with significant reduction in output speed when engaged in activities.  He acknowledged symptoms of depression and continues to take Lexapro without apparent side effects and some benefit.  The patient's mother acknowledges clear patterns of slowed information processing speed and significant delays in physical output.  Patient's mother reports that they have a particular interest to find out if there are any neurological abnormalities or injuries, which may have contributed to his anxiety/depression and slow output/reduced information  processing and want to know what might be able to be done to help with the symptoms.  Primary issues are moderate clinical depression, information processing difficulties and disorder with significant impact on his work capacity and difficulty dealing with sadness and anger.  During the clinical interview today, the patient had clear difficulties articulating what he was thinking and was slow in appearance of processing information.   When looking back at history, the patient's mother reports that the patient was talking fine and initially doing well in school and the patient knew a lot of information.  The patient began developing symptoms consistent with depression and when he was given assignments in school took an excessively long time but when he completed his work they were done to perfection.  In preschool, the patient was quite social and enjoys singing and was very smart and well-spoken with an advanced vocabulary.  When school began placing increasing demands to get his work done on time he began having significant difficulties.  As volume of schoolwork increased his difficulties with completing work became more more evident.  However, whenever he did complete assignments they were always done very well and almost to perfection.   A major issue of note includes a potential head injury/neck injury when he was a very small child.  The patient is reported to have fallen down the stairs as a small child without apparent injury at the time.  This predated the development of described difficulties   Patient continues to deal with significant depression, that has been severe at times, over the past 2 to 3 years.  He continues to take citalopram.  The patient left college and has been unable to maintain effective school or  occupational work patterns.  The patient's mother is just trying to find out what could be done to help him and to get support systems in place as he has been struggling going to school  and working.   While I do not have previous evaluations available, patient's mother reports that at 1 point the patient was diagnosed with an autistic spectrum disorder and the patient had some significant obsessional thinking about how he will complete task with intrusive thoughts and he has had a hard time being around people and often gets treated poorly by others.  Patient has longstanding difficulties regulating his emotions with significant depressive symptoms, difficulties in information processing speed with getting fixated on some aspects.   Medical History:                         Past Medical History:  Diagnosis Date   Asthma     Eczema                                                                 Patient Active Problem List    Diagnosis Date Noted   Neutropenia (HCC) 01/09/2017      Onset and Duration of Symptoms: Patient started out with good social patterns and behaviors early in school but as demands increased and he continued to progress to school they became more and more evident.  Patient has struggled since elementary school and has progressively withdrawn with greater difficulties in communication skills.   Associated Symptoms (e.g., cognitive, emotional, behavioral): Patient has significant interpersonal/relationship challenges and has had some negative experiences with other people being unkind or unfriendly to him due to his behavioral and social interaction patterns.   Additional Tests and Measures from other records:   Neuroimaging Results: Patient had an MRI head without contrast conducted on 10/17/2018 due to concerns around developmental delays and cognitive concerns.  The impression produced by Marlan Palau, MD was of a normal MRI of brain with no acute infarction, hemorrhage, hydrocephalus or indications of acute or chronic abnormalities.   Laboratory Tests: Patient has had workup by [facility] healthcare rheumatology department in 2018 for evaluation of  potential leukopenia in the setting of positive ANA.  Patient has had chronic low white blood cell count for years.  Patient has also had bone marrow biopsy at [facility] in 2018.  While the patient had a positive ANA other serology studies were negative for abnormalities.  There were no indications of connective tissue disease or other issues.   Current Typical Mood State:  Depressed, Ambivalent, Anhedonia, Despair, Helpless, and Preoccupied   Sleep: Patient is described by himself and his mother as sleeping more than his normal.  Patient goes to bed and sleeps between 9 and 12 hours a day.  The patient reports that when he does go to sleep and wake up it can be very erratic sleeping pattern with no consistent schedule.   Diet Pattern: Patient is described as having a low appetite since depression started.  2 meals a day and sometimes 1.  The patient is a rather picky eater.  Tests Administered: Comprehensive Attention Battery (CAB) Continuous Performance Test (CPT) Delis Arlyce Dice Executive Functioning System (D-KEFS), Verbal Fluency Test Finger Tapping Test (FTT) Grooved Pegboard  Trail Making Test (TMT; Part A & B) Wechsler Adult Intelligence Scale, 4th Edition (WAIS-IV)  Participation Level:   Active  Participation Quality:  Appropriate      Behavioral Observation:  The patient appeared well-groomed and appropriately dressed. His manners were polite and appropriate to the situation. The patient's attitude towards testing was positive and his effort was good.   Well Groomed, Alert, and Appropriate.   Test Results:   Initially, an estimation was made as to premorbid intellectual and cognitive functioning.  The patient is currently a sophomore in college and is maintained a 3.3 GPA but has stopped taking classes currently.  Patient graduated from [employer] and also attended a private school.  Patient is currently a Consulting civil engineer at the [employer].  Patient  always did well in math and history classes.  Patient struggled in public speaking particularly when asked to in [place] classes.  Patient is currently not working and has worked various jobs simply to earn income while in school.  Patient has had difficulty holding onto a job and has quit various jobs.  It is estimated that the patient likely has been functioning in the high average to superior range and intellectual and cognitive functioning with some domains likely to be in the superior range particularly intellectual/cognitive domains.  Secondly, determination was made as to the validity of the current assessment.  The patient appeared to try his hardest throughout the testing and embedded validity checks were all within normal limits with no indications of attempting to exaggerate or amplify any particular difficulty.  This does appear to be a fair and valid assessment of the patient's current cognitive functioning.  D-KEFS Verbal Fluency:  Verbal Fluency Test (Standard) Letter Fluency total correct scaled score    10                                                                                                                                 Verbal Fluency Test (Standard) Category Fluency total correct scaled score          8                                                                                                           Verbal Fluency Test (Standard) Category Switching total correct scaled score       9  Verbal Fluency Test (Standard) Switching accuracy total correct scaled score10   As the patient had described difficulties with language speed and fluency he was administered the verbal fluency task from the D-KEFS test battery.  The patient performed in the average range on both lexical and semantic fluency measures and there were no indications of any deficits in  expressive language capacity.  Finger Tapping Test (FTT):  R (DH) Average= 50.2  Percentile Rank= 43rd  L (NDH) Average= 39.8  Percentile Rank= 34th Has the patient has described slow motor function and output we also measured fine motor speed utilizing the finger tapping Test.  While the patient's performance was in the lower end of the average range there was consistency in general between his right dominant hand and left nondominant hand although the patient's handedness may be more towards ambidextrous nature than clear lateralized handedness.  There were no indications of any significant deficits with regard to fine motor speed.  Grooved Pegboard:  R (DH) time= 95s Percentile Rank= 23rd  L (NDH) time= 87s  Percentile Rank= 29th   We also administered the grooved pegboard test to assess fine motor control and any indications of lateralization of findings.  While the patient's performance was somewhat slow relative to normative expectations his performances were still in the lower end of the average range and generally consistent between dominant nondominant hands but in this case his left nondominant hand actually performed better relatively speaking although again handedness is not absolutely clear.  Comprehensive Attention Battery and CAB CPT measure:  The patient was also administered the comprehensive attention battery to provide a thorough assessment of a wide range of varying attention and concentration domains and components along with various executive functioning components.  Initially, the patient was administered the auditory visual pure reaction time measures.  The patient had a perfect accuracy score for both visual and auditory reaction time measures correctly identifying 50 of 50 targets with no errors of omission.  His average response time was a little slow but still at the upper end of the average range relative to normative comparison group.  Embedded validity checks within  this measure were all within normal limits indicating the rest of the comprehensive attention battery is interpretable and valid.  The patient was then administered the discriminate reaction time test.  On the visual discriminate reaction time measure the patient had a perfect accuracy score correctly responded to 35 of 35 targets with no errors of omission and no errors of commission.  Average response time was 706 ms which was 1 standard deviation slow for comparison purposes.  A similar pattern of perfect accuracy scores was also derived for both the auditory and mixed/shift discriminant reaction time measures with perfect accuracy scores and no errors of commission or errors of omission.  Average response time for the auditory discriminate reaction time measure was 790 ms which is at the upper end of the average range and 933 ms for the shift discriminant reaction time which is also at the upper end of the average range.  Other than being  WAIS-IV:            Composite Score Summary          Scale Sum of Scaled Scores Composite Score Percentile Rank 95% Conf. Interval Qualitative Description  Verbal Comprehension 40 VCI 118 88 112-123 High Average  Perceptual Reasoning 33 PRI 105 63 99-111 Average  Working Memory 32 WMI 133 99 124-138 Very Superior  Processing Speed 13 PSI  81 10 75-91 Low Average  Full Scale 118 FSIQ 112 79 108-116 High Average  General Ability 73 GAI 113 81 108-118 High Average    In order to provide a thorough assessment of a wide range of cognitive domains and a structured well normed battery of assessments that can be repeated in the future if needed the patient was administered the Wechsler Adult Intelligence Scale-IV.  2 composite/Global scores were calculated in this assessment.  The patient produced a full-scale IQ score of 112 with falls at the 79th percentile and is in the high average range relative to a normative population.  This is generally consistent with  expected functioning levels based on premorbid estimates and there was significant variability within individual cognitive domains with some performing in the superior/very superior range and others in the average and low average range.  The patient also produced a general abilities index score of 113 which fell at the 81st percentile.  The patient did particularly well on measures of verbal comprehension and language based skills as well as auditory encoding measures where he excelled.  Verbal Comprehension Subtests Summary  Subtest Raw Score Scaled Score Percentile Rank Reference Group Scaled Score SEM  Similarities 29 12 75 12 1.12  Vocabulary 49 14 91 14 0.79  Information 19 14 91 14 0.90  (Comprehension) 22 9 37 9 1.04   The patient produced a verbal comprehension index score of 118 which falls at the 88th percentile and is in the high average range relative to a normative population.  There was some considerable scatter noted with the patient performing in the upper end of the average to high average range on measures of verbal reasoning and problem-solving, vocabulary knowledge and general fund of information.  The patient had a lower score on comprehension subtest which assesses social judgment comprehension aspects but this was still in the average range relative to a normative population.          Perceptual Reasoning Subtests Summary        Subtest Raw Score Scaled Score Percentile Rank Reference Group Scaled Score SEM  Block Design 40 9 37 9 0.95  Matrix Reasoning 20 11 63 11 0.90  Visual Puzzles 20 13 84 13 0.90  (Figure Weights) 20 12 75 12 0.90  (Picture Completion) 14 10 50 10 1.27    The patient produced a perceptual reasoning index score of 105 which falls at the 63rd percentile and is in the average range relative to a normative population.  The patient performed in the average range on measures of hold part recognition skills and visual analysis and organizational abilities,  nonverbal reasoning skills and broad visual intelligence, visual estimation and prediction capacity and ability to identify visual anomalies within a visual gestalt.  The patient did very well on measures of fluid reasoning skills and thinking and visual patterns.          Working Comptroller Raw Score Scaled Score Percentile Rank Reference Group Scaled Score SEM  Digit Span 38 15 95 15 0.73  Arithmetic 21 17 99 17 0.99  (Letter-Number Seq.) 23 12 75 12 1.04    The patient produced a working memory index score of 133 which falls at the 99th percentile and in the superior range relative to a normative population.  The patient did very well on primary auditory encoding capacity and excelled that his capacity to actively process information in his auditory Register.  Processing Speed Subtests Summary        Subtest Raw Score Scaled Score Percentile Rank Reference Group Scaled Score SEM  Symbol Search 24 7 16 7  1.31  Coding 48 6 9 6  1.16  (Cancellation) 16 2 0.4 2 1.31   The patient produced a processing speed index score of 81 which falls at the 10th percentile and in the low average range relative to a normative population.  This essentially was the only area of difficulties that the patient displayed showing slowed processing speed related to visual scanning, visual searching and overall speed of mental operations.  The patient was exceptionally deliberate throughout these measures clearly making efforts at accuracy versus speed during these tasks.  Summary of Results:   The results of the current neuropsychological evaluation did not show any clear pattern of neuropsychological deficits outside of slowed information processing speed.  The patient performed exceptionally well on measures of auditory encoding and all aspects of attention and concentration outside of focus execute/information processing speed variables.  The patient's expressive language abilities  measured in a number of different ways were all within normal limits and the patient's visual-spatial capacity, executive functioning, and language based skills were all equal and consistent with premorbid estimates.  There was only 1 specific area of weakness/deficits noted and that had to do with information processing speed and this was most accounted for by clear preferential strategies of accuracy over speed when a wide range of different measures were done.  The patient's pure reaction time and discriminant reaction times were all within normal limits outside of this deliberate nature and there were clearly no indications of widespread diffuse axonal injury or abnormalities on his MRI that would indicate that the slowed information processing speeds or related to neurological abnormalities rather than longstanding individual patterns and strategies.  Impression/Diagnosis:   The results of the current neuropsychological evaluation are not consistent with any underlying neurological deficits and the patient performed consistent with premorbid estimates on the almost all cognitive domains assessed.  Expressive and receptive language capacity, all aspects of attention and concentration outside of focus execute abilities and information processing speed measures, visual-spatial and visual reasoning and problem-solving, verbal based skills and verbal reasoning and problem-solving were all within normal limits.  There was no indication of lateralization with regard to fine motor control or fine motor speed and while some of these measures were slow when speed was assessed there was no deficits as far as accuracy.  Taking into account the patient's longstanding pattern since elementary school the most salient diagnostic consideration would be a level 1 variant of autistic spectrum disorder related to inflexibility of behavioral patterns and social communication difficulties with atypical and/or unsuccessful social  interactions historically.  This autistic spectrum disorder would be without intellectual impairments but would be consistent with inflexible adherence to routines and distress related to small changes in transitions in his life.  The patient has had difficulty with weight others have reacted to him and his difficulty with behavioral style is likely led to consistent negative social interactions and inability to maintain clear coping strategies and adaptive abilities.  The patient has a history of obsessive-compulsive type traits throughout his life.  Patient adheres to very strict patterns looking for absolute correct performance on any type of academic or work related challenge which leads to slow completion of task but are likely to be frustrating to employers and has been frustrating to teachers in academic settings in the past.  As far as  recommendations, I do think that the patient would very much benefit from therapeutic interventions around building coping skills and strategies to adjust to his longstanding autistic spectrum disorder type symptoms.  The patient had been considered and or diagnosed with this in the past but is not maintained that diagnosis going forward.  Significant symptoms of depression and anxiety have likely developed with consistent challenges in meeting expected psychosocial demands and difficulty maintaining consistent work at the expected speed of others.  I will sit down with the patient and his mom if he would like to and go over those results with diagnostic considerations and going to greater depth regarding therapeutic strategies.  The patient clearly has depressive responses to these stressors and difficulties with coping and challenging.  Diagnosis:    Autistic spectrum disorder  Major depressive disorder, recurrent episode, moderate (HCC)  Mixed obsessional thoughts and acts  The autistic spectrum disorder would be in the type I category without intellectual  impairments.   _____________________ Arley Phenix, Psy.D. Clinical Neuropsychologist

## 2023-11-09 ENCOUNTER — Encounter: Payer: BC Managed Care – PPO | Admitting: Psychology

## 2023-11-09 DIAGNOSIS — F331 Major depressive disorder, recurrent, moderate: Secondary | ICD-10-CM

## 2023-11-09 DIAGNOSIS — F840 Autistic disorder: Secondary | ICD-10-CM | POA: Diagnosis not present

## 2023-11-09 DIAGNOSIS — F422 Mixed obsessional thoughts and acts: Secondary | ICD-10-CM

## 2023-12-05 ENCOUNTER — Encounter: Payer: Self-pay | Admitting: Psychology

## 2024-06-20 DIAGNOSIS — Z0000 Encounter for general adult medical examination without abnormal findings: Secondary | ICD-10-CM | POA: Diagnosis not present

## 2024-06-20 DIAGNOSIS — Z23 Encounter for immunization: Secondary | ICD-10-CM | POA: Diagnosis not present

## 2024-06-20 DIAGNOSIS — E559 Vitamin D deficiency, unspecified: Secondary | ICD-10-CM | POA: Diagnosis not present
